# Patient Record
Sex: Female | Born: 1950 | Race: Black or African American | Hispanic: No | Marital: Married | Smoking: Former smoker
Health system: Southern US, Community
[De-identification: ages and names within clinical notes are randomized; demographics above are authoritative.]

## PROBLEM LIST (undated history)

## (undated) DIAGNOSIS — C719 Malignant neoplasm of brain, unspecified: Secondary | ICD-10-CM

## (undated) DIAGNOSIS — M109 Gout, unspecified: Secondary | ICD-10-CM

## (undated) DIAGNOSIS — F329 Major depressive disorder, single episode, unspecified: Secondary | ICD-10-CM

## (undated) DIAGNOSIS — Z9884 Bariatric surgery status: Secondary | ICD-10-CM

## (undated) DIAGNOSIS — F32A Depression, unspecified: Secondary | ICD-10-CM

## (undated) DIAGNOSIS — K8020 Calculus of gallbladder without cholecystitis without obstruction: Secondary | ICD-10-CM

## (undated) DIAGNOSIS — I10 Essential (primary) hypertension: Secondary | ICD-10-CM

## (undated) DIAGNOSIS — L309 Dermatitis, unspecified: Secondary | ICD-10-CM

## (undated) DIAGNOSIS — E669 Obesity, unspecified: Secondary | ICD-10-CM

## (undated) DIAGNOSIS — I872 Venous insufficiency (chronic) (peripheral): Secondary | ICD-10-CM

## (undated) DIAGNOSIS — H8110 Benign paroxysmal vertigo, unspecified ear: Secondary | ICD-10-CM

## (undated) DIAGNOSIS — G4733 Obstructive sleep apnea (adult) (pediatric): Secondary | ICD-10-CM

## (undated) DIAGNOSIS — E785 Hyperlipidemia, unspecified: Secondary | ICD-10-CM

## (undated) DIAGNOSIS — E559 Vitamin D deficiency, unspecified: Secondary | ICD-10-CM

## (undated) HISTORY — DX: Calculus of gallbladder without cholecystitis without obstruction: K80.20

## (undated) HISTORY — DX: Obstructive sleep apnea (adult) (pediatric): G47.33

## (undated) HISTORY — DX: Bariatric surgery status: Z98.84

## (undated) HISTORY — DX: Vitamin D deficiency, unspecified: E55.9

## (undated) HISTORY — DX: Benign paroxysmal vertigo, unspecified ear: H81.10

## (undated) HISTORY — DX: Hyperlipidemia, unspecified: E78.5

## (undated) HISTORY — DX: Depression, unspecified: F32.A

## (undated) HISTORY — DX: Venous insufficiency (chronic) (peripheral): I87.2

## (undated) HISTORY — DX: Dermatitis, unspecified: L30.9

## (undated) HISTORY — DX: Obesity, unspecified: E66.9

## (undated) HISTORY — DX: Gout, unspecified: M10.9

## (undated) HISTORY — DX: Essential (primary) hypertension: I10

---
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
## 2001-01-22 HISTORY — PX: BUNIONECTOMY: SHX129

## 2006-01-22 HISTORY — PX: LAPAROSCOPIC GASTRIC BANDING: SHX1100

## 2008-04-15 LAB — HM DEXA SCAN

## 2009-01-22 LAB — HM COLONOSCOPY: HM Colonoscopy: NORMAL

## 2011-04-23 DIAGNOSIS — Z9884 Bariatric surgery status: Secondary | ICD-10-CM | POA: Insufficient documentation

## 2011-04-23 HISTORY — PX: LAPAROSCOPIC GASTRIC BAND REMOVAL WITH LAPAROSCOPIC GASTRIC SLEEVE RESECTION: SHX6498

## 2012-02-26 ENCOUNTER — Ambulatory Visit: Payer: Self-pay | Admitting: Specialist

## 2012-03-10 ENCOUNTER — Ambulatory Visit: Payer: Self-pay | Admitting: Specialist

## 2012-03-10 LAB — CBC WITH DIFFERENTIAL/PLATELET
Basophil #: 0 x10 3/mm 3
Basophil %: 0.7 %
Eosinophil #: 0.2 x10 3/mm 3
Eosinophil %: 2.9 %
HCT: 41.4 %
HGB: 13.7 g/dL
Lymphocyte %: 25.7 %
Lymphs Abs: 1.8 x10 3/mm 3
MCH: 30.5 pg
MCHC: 33.1 g/dL
MCV: 92 fL
Monocyte #: 0.4 "x10 3/mm "
Monocyte %: 5.5 %
Neutrophil #: 4.5 x10 3/mm 3
Neutrophil %: 65.2 %
Platelet: 333 x10 3/mm 3
RBC: 4.49 X10 6/mm 3
RDW: 14.3 %
WBC: 7 x10 3/mm 3

## 2012-03-10 LAB — LIPASE, BLOOD: Lipase: 113 U/L (ref 73–393)

## 2012-03-10 LAB — COMPREHENSIVE METABOLIC PANEL WITH GFR
Albumin: 3.6 g/dL
Alkaline Phosphatase: 106 U/L
Anion Gap: 7
BUN: 18 mg/dL
Bilirubin,Total: 0.5 mg/dL
Calcium, Total: 9.4 mg/dL
Chloride: 107 mmol/L
Co2: 29 mmol/L
Creatinine: 1.05 mg/dL
EGFR (African American): >60
EGFR (Non-African Amer.): 57 — ABNORMAL LOW
Glucose: 87 mg/dL
Osmolality: 286
Potassium: 4.1 mmol/L
SGOT(AST): 66 U/L — ABNORMAL HIGH
SGPT (ALT): 104 U/L — ABNORMAL HIGH
Sodium: 143 mmol/L
Total Protein: 7.9 g/dL

## 2012-03-10 LAB — FOLATE: Folic Acid: 13.2 ng/mL

## 2012-03-10 LAB — IRON AND TIBC
Iron Bind.Cap.(Total): 292 ug/dL (ref 250–450)
Iron Saturation: 33 %
Iron: 97 ug/dL (ref 50–170)

## 2012-03-10 LAB — FERRITIN: Ferritin (ARMC): 180 ng/mL

## 2012-03-10 LAB — PROTIME-INR
INR: 0.9
Prothrombin Time: 12.2 s

## 2012-03-10 LAB — AMYLASE: Amylase: 43 U/L (ref 25–115)

## 2012-03-10 LAB — MAGNESIUM: Magnesium: 1.6 mg/dL — ABNORMAL LOW

## 2012-03-10 LAB — TSH: Thyroid Stimulating Horm: 3.94 u[IU]/mL

## 2012-03-10 LAB — APTT: Activated PTT: 32 s

## 2012-03-11 LAB — BILIRUBIN, DIRECT: Bilirubin, Direct: <0.1 mg/dL (ref 0.00–0.20)

## 2012-03-22 ENCOUNTER — Ambulatory Visit: Payer: Self-pay | Admitting: Specialist

## 2012-05-20 ENCOUNTER — Ambulatory Visit: Payer: Self-pay | Admitting: Specialist

## 2012-05-22 LAB — HM PAP SMEAR: HM Pap smear: NORMAL

## 2012-06-22 ENCOUNTER — Ambulatory Visit: Payer: Self-pay | Admitting: Specialist

## 2013-08-06 LAB — HM MAMMOGRAPHY: HM Mammogram: NORMAL

## 2013-08-13 LAB — LIPID PANEL
CHOLESTEROL: 223 mg/dL — AB (ref 0–200)
HDL: 66 mg/dL (ref 35–70)
LDL CALC: 126 mg/dL
Triglycerides: 157 mg/dL (ref 40–160)

## 2014-02-25 ENCOUNTER — Ambulatory Visit: Payer: Self-pay | Admitting: Gastroenterology

## 2014-09-10 DIAGNOSIS — Z1231 Encounter for screening mammogram for malignant neoplasm of breast: Secondary | ICD-10-CM | POA: Diagnosis not present

## 2014-09-10 DIAGNOSIS — Z1211 Encounter for screening for malignant neoplasm of colon: Secondary | ICD-10-CM | POA: Diagnosis not present

## 2014-09-10 DIAGNOSIS — R87820 Cervical low risk human papillomavirus (HPV) DNA test positive: Secondary | ICD-10-CM | POA: Insufficient documentation

## 2014-09-10 DIAGNOSIS — Z01419 Encounter for gynecological examination (general) (routine) without abnormal findings: Secondary | ICD-10-CM | POA: Diagnosis not present

## 2014-09-10 DIAGNOSIS — R87810 Cervical high risk human papillomavirus (HPV) DNA test positive: Secondary | ICD-10-CM | POA: Diagnosis not present

## 2014-09-10 LAB — HM PAP SMEAR: HM PAP: POSITIVE

## 2014-09-10 LAB — HM MAMMOGRAPHY: HM MAMMO: NORMAL (ref 0–4)

## 2014-09-14 ENCOUNTER — Other Ambulatory Visit: Payer: Self-pay | Admitting: Family Medicine

## 2014-09-14 NOTE — Telephone Encounter (Signed)
Patient requesting refill. 

## 2014-09-29 ENCOUNTER — Encounter: Payer: Self-pay | Admitting: Family Medicine

## 2014-10-18 ENCOUNTER — Encounter: Payer: Self-pay | Admitting: Family Medicine

## 2014-10-18 ENCOUNTER — Ambulatory Visit (INDEPENDENT_AMBULATORY_CARE_PROVIDER_SITE_OTHER): Payer: Medicare Other | Admitting: Family Medicine

## 2014-10-18 VITALS — BP 128/84 | HR 61 | Temp 98.1°F | Resp 16 | Ht 66.0 in | Wt 248.6 lb

## 2014-10-18 DIAGNOSIS — Z0000 Encounter for general adult medical examination without abnormal findings: Secondary | ICD-10-CM | POA: Diagnosis not present

## 2014-10-18 DIAGNOSIS — M109 Gout, unspecified: Secondary | ICD-10-CM | POA: Diagnosis not present

## 2014-10-18 DIAGNOSIS — Z9989 Dependence on other enabling machines and devices: Secondary | ICD-10-CM

## 2014-10-18 DIAGNOSIS — I872 Venous insufficiency (chronic) (peripheral): Secondary | ICD-10-CM | POA: Insufficient documentation

## 2014-10-18 DIAGNOSIS — E785 Hyperlipidemia, unspecified: Secondary | ICD-10-CM | POA: Diagnosis not present

## 2014-10-18 DIAGNOSIS — G4733 Obstructive sleep apnea (adult) (pediatric): Secondary | ICD-10-CM

## 2014-10-18 DIAGNOSIS — E559 Vitamin D deficiency, unspecified: Secondary | ICD-10-CM | POA: Diagnosis not present

## 2014-10-18 DIAGNOSIS — Z9884 Bariatric surgery status: Secondary | ICD-10-CM

## 2014-10-18 DIAGNOSIS — Z23 Encounter for immunization: Secondary | ICD-10-CM

## 2014-10-18 DIAGNOSIS — L309 Dermatitis, unspecified: Secondary | ICD-10-CM | POA: Insufficient documentation

## 2014-10-18 DIAGNOSIS — I10 Essential (primary) hypertension: Secondary | ICD-10-CM

## 2014-10-18 DIAGNOSIS — K2270 Barrett's esophagus without dysplasia: Secondary | ICD-10-CM | POA: Diagnosis not present

## 2014-10-18 DIAGNOSIS — R42 Dizziness and giddiness: Secondary | ICD-10-CM | POA: Diagnosis not present

## 2014-10-18 DIAGNOSIS — K219 Gastro-esophageal reflux disease without esophagitis: Secondary | ICD-10-CM | POA: Insufficient documentation

## 2014-10-18 DIAGNOSIS — E2839 Other primary ovarian failure: Secondary | ICD-10-CM | POA: Diagnosis not present

## 2014-10-18 MED ORDER — VITAMIN D 1000 UNITS PO TABS: 1000.0000 [IU] | ORAL_TABLET | Freq: Every day | ORAL | Status: AC

## 2014-10-18 NOTE — Progress Notes (Signed)
Name: Shannon Petty   MRN: [id number]    DOB: [birth year]   Date:10/18/2014       Progress Note  Subjective  Chief Complaint  Chief Complaint  Patient presents with  . Annual Exam    HPI  Functional ability/safety issues: No Issues Hearing issues: Addressed  Activities of daily living: Discussed Home safety issues: discussed removing lose rugs, night lights, tub bench, raised toilett seat  End Of Life Planning: Offered verbal information regarding advanced directives, healthcare power of attorney. ( they are working on it now)   Preventative care, Health maintenance, Preventative health measures discussed.  Preventative screenings discussed today: lab work, colonoscopy, PAP, mammogram, DEXA.  Low Dose CT Chest recommended if Age 71-80 years, 30 pack-year currently smoking OR have quit w/in 15years. Quit smoking over 15 years ago   Lifestyle risk factor issued reviewed: Diet, exercise, weight management, advised patient smoking is not healthy, nutrition/diet.  Preventative health measures discussed (5-10 year plan).  Reviewed and recommended vaccinations: - Pneumovax  - Prevnar  - Annual Influenza - Zostavax - Tdap   Depression screening: Done Fall risk screening: Done Discuss ADLs/IADLs: Done  Current medical providers: See HPI  Other health risk factors identified this visit: No other issues Cognitive impairment issues: None identified  All above discussed with patient. Appropriate education, counseling and referral will be made based upon the above.    Vertigo: she has a long history of intermittent vertigo, however over the past week has been worse. Symptoms of spinning sensation associated with nausea but no vomiting, happens with head movement, such as turning in bed during the night. She is feeling a little better today. No hearing loss, no tinnitus, no neuro deficit.   HTN: taking bp medication daily and no SOB, no chest pain, no palpitation. No side effects.    Hyperlipidemia: taking Atorvastatin daily , no side effects  Gout: she states she has not have a gout attacks in years, taking Allopurinol   OSA: she wears CPAP machine every night for at least 7-8 hours. No snoring with CPAP, wakes up feeling rested.  Barrett Esophagus: sees Dr. Durwin Reges, currently skipping Nexium because she saw an article that there were risks associated with taking medication. Symptoms under control, occasionally has regurgitation. Explained the importance of compliance to decrease change of esophageal cancer  Morbid Obesity: had lap band in 2008 and revision with sleeve in 2013, she gained a couple of pounds since last visit, she needs to increase physical activity   Patient Active Problem List   Diagnosis Date Noted  . Barrett esophagus 10/18/2014  . Morbid obesity 10/18/2014  . Chronic eczema 10/18/2014  . GERD (gastroesophageal reflux disease) 10/18/2014  . Hypertension, benign 10/18/2014  . Vitamin D deficiency 10/18/2014  . Controlled gout 10/18/2014  . Hyperlipidemia 10/18/2014  . Venous insufficiency 10/18/2014  . OSA on CPAP 10/18/2014  . History of bariatric surgery 04/23/2011    Past Surgical History  Procedure Laterality Date  . Bunionectomy  2003  . Bariatric surgery  2008  . Sleeve gastroplasty  04/23/2011    Family History  Problem Relation Age of Onset  . Hypertension Mother   . Diabetes Mother   . Diabetes Brother     1  . Multiple sclerosis Brother     1-Deceased    Social History   Social History  . Marital Status: Married    Spouse Name: N/A  . Number of Children: N/A  . Years of Education: N/A   Occupational  History  . Not on file.   Social History Main Topics  . Smoking status: Former Smoker    Types: Cigarettes    Quit date: 10/17/1984  . Smokeless tobacco: Never Used  . Alcohol Use: 0.0 oz/week    0 Standard drinks or equivalent per week     Comment: socially  . Drug Use: No  . Sexual Activity:    Partners:  Male   Other Topics Concern  . Not on file   Social History Narrative     Current outpatient prescriptions:  .  aspirin 81 MG tablet, Take 81 mg by mouth daily., Disp: , Rfl:  .  NEXIUM 40 MG capsule, Take 40 mg by mouth 2 (two) times daily., Disp: , Rfl: 11 .  spironolactone (ALDACTONE) 50 MG tablet, TAKE 1 TABLET BY MOUTH EVERY DAY, Disp: 90 tablet, Rfl: 0 .  ULORIC 40 MG tablet, TAKE 1 TABLET BY MOUTH EVERY DAY, Disp: 90 tablet, Rfl: 0 .  cholecalciferol (VITAMIN D) 1000 UNITS tablet, Take 1 tablet (1,000 Units total) by mouth daily., Disp: 30 tablet, Rfl: 0  Allergies  Allergen Reactions  . Allopurinol      ROS  Constitutional: Negative for fever or weight change.  Respiratory: Negative for cough and shortness of breath.   Cardiovascular: Negative for chest pain or palpitations.  Gastrointestinal: Negative for abdominal pain, no bowel changes.  Musculoskeletal: Negative for gait problem or joint swelling.  Skin: Negative for rash.  Neurological: Positive for dizziness no  headache.  No other specific complaints in a complete review of systems (except as listed in HPI above).   Objective  Filed Vitals:   10/18/14 1137  BP: 128/84  Pulse: 61  Temp: 98.1 F (36.7 C)  TempSrc: Oral  Resp: 16  Height: 5\' 6"  (1.676 m)  Weight: 248 lb 9.6 oz (112.764 kg)  SpO2: 92%    Body mass index is 40.14 kg/(m^2).  Physical Exam  Constitutional: Patient appears well-developed and obese. No distress.  HENT: Head: Normocephalic and atraumatic. Ears: B TMs ok, no erythema or effusion; Nose: Nose normal. Mouth/Throat: Oropharynx is clear and moist. No oropharyngeal exudate.  Eyes: Conjunctivae and EOM are normal. Pupils are equal, round, and reactive to light. No scleral icterus.  Neck: Normal range of motion. Neck supple. No JVD present. No thyromegaly present.  Cardiovascular: Normal rate, regular rhythm and normal heart sounds.  No murmur heard. No BLE  edema. Pulmonary/Chest: Effort normal and breath sounds normal. No respiratory distress. Abdominal: Soft. Bowel sounds are normal, no distension. There is no tenderness. no masses Musculoskeletal: Normal range of motion, no joint effusions. No gross deformities Neurological: he is alert and oriented to person, place, and time. No cranial nerve deficit. Coordination, balance, strength, speech and gait are normal.  Skin: Skin is warm and dry. No rash noted. No erythema.  Psychiatric: Patient has a normal mood and affect. behavior is normal. Judgment and thought content normal.  PHQ2/9: Depression screen PHQ 2/9 10/18/2014  Decreased Interest 0  Down, Depressed, Hopeless 0  PHQ - 2 Score 0    Fall Risk: Fall Risk  10/18/2014  Falls in the past year? No     Functional Status Survey: Is the patient deaf or have difficulty hearing?: No Does the patient have difficulty seeing, even when wearing glasses/contacts?: Yes (glasses) Does the patient have difficulty concentrating, remembering, or making decisions?: No Does the patient have difficulty walking or climbing stairs?: No Does the patient have difficulty dressing or  bathing?: No Does the patient have difficulty doing errands alone such as visiting a doctor's office or shopping?: No   Assessment & Plan  1. Welcome to Medicare preventive visit  Discussed importance of 150 minutes of physical activity weekly, eat two servings of fish weekly, eat one serving of tree nuts ( cashews, pistachios, pecans, almonds.Marland Kitchen) every other day, eat 6 servings of fruit/vegetables daily and drink plenty of water and avoid sweet beverages.   2. Needs flu shot  - Flu vaccine HIGH DOSE PF (Fluzone High dose)  3. Barrett esophagus  Resume twice daily PPI  4. Morbid obesity  Discussed [employer]   5. Hypertension, benign   - Comprehensive metabolic panel - CBC with Differential/Platelet - EKG 12-Lead  6. Vitamin D deficiency  -  cholecalciferol (VITAMIN D) 1000 UNITS tablet; Take 1 tablet (1,000 Units total) by mouth daily.  Dispense: 30 tablet; Refill: 0 - Vit D  25 hydroxy (rtn osteoporosis monitoring)  7. History of bariatric surgery  - DG Bone Density; Future  8. Controlled gout  - Uric acid  9. Hyperlipidemia  - Lipid panel  10. OSA on CPAP   11. Ovarian failure  - DG Bone Density; Future  12. Need for pneumococcal vaccination  - Pneumococcal polysaccharide vaccine 23-valent greater than or equal to 2yo subcutaneous/IM  13. Vertigo   likely BPPV, refer to ENT - Ambulatory referral to ENT

## 2014-12-03 ENCOUNTER — Other Ambulatory Visit: Payer: Self-pay | Admitting: Family Medicine

## 2014-12-03 DIAGNOSIS — E785 Hyperlipidemia, unspecified: Secondary | ICD-10-CM

## 2014-12-03 DIAGNOSIS — E559 Vitamin D deficiency, unspecified: Secondary | ICD-10-CM

## 2014-12-03 DIAGNOSIS — M109 Gout, unspecified: Secondary | ICD-10-CM

## 2014-12-03 DIAGNOSIS — I10 Essential (primary) hypertension: Secondary | ICD-10-CM

## 2014-12-14 ENCOUNTER — Other Ambulatory Visit: Payer: Self-pay | Admitting: Family Medicine

## 2014-12-14 NOTE — Telephone Encounter (Signed)
Patient requesting refill. 

## 2014-12-15 NOTE — Telephone Encounter (Signed)
Left voice message to inform patient that prescriptions where sent to the pharmacy and that she need to be seen in March 2017

## 2014-12-20 ENCOUNTER — Ambulatory Visit (INDEPENDENT_AMBULATORY_CARE_PROVIDER_SITE_OTHER): Payer: Medicare Other | Admitting: Family Medicine

## 2014-12-20 ENCOUNTER — Encounter: Payer: Self-pay | Admitting: Family Medicine

## 2014-12-20 VITALS — BP 138/100 | HR 64 | Temp 98.9°F | Resp 16 | Wt 254.8 lb

## 2014-12-20 DIAGNOSIS — R110 Nausea: Secondary | ICD-10-CM

## 2014-12-20 DIAGNOSIS — R42 Dizziness and giddiness: Secondary | ICD-10-CM

## 2014-12-20 DIAGNOSIS — I10 Essential (primary) hypertension: Secondary | ICD-10-CM | POA: Diagnosis not present

## 2014-12-20 MED ORDER — PROMETHAZINE HCL 12.5 MG PO TABS: 12.5000 mg | ORAL_TABLET | Freq: Three times a day (TID) | ORAL | Status: DC | PRN

## 2014-12-20 NOTE — Patient Instructions (Signed)
Benign Positional Vertigo Vertigo is the feeling that you or your surroundings are moving when they are not. Benign positional vertigo is the most common form of vertigo. The cause of this condition is not serious (is benign). This condition is triggered by certain movements and positions (is positional). This condition can be dangerous if it occurs while you are doing something that could endanger you or others, such as driving.  CAUSES In many cases, the cause of this condition is not known. It may be caused by a disturbance in an area of the inner ear that helps your brain to sense movement and balance. This disturbance can be caused by a viral infection (labyrinthitis), head injury, or repetitive motion. RISK FACTORS This condition is more likely to develop in:  Women.  People who are 50 years of age or older. SYMPTOMS Symptoms of this condition usually happen when you move your head or your eyes in different directions. Symptoms may start suddenly, and they usually last for less than a minute. Symptoms may include:  Loss of balance and falling.  Feeling like you are spinning or moving.  Feeling like your surroundings are spinning or moving.  Nausea and vomiting.  Blurred vision.  Dizziness.  Involuntary eye movement (nystagmus). Symptoms can be mild and cause only slight annoyance, or they can be severe and interfere with daily life. Episodes of benign positional vertigo may return (recur) over time, and they may be triggered by certain movements. Symptoms may improve over time. DIAGNOSIS This condition is usually diagnosed by medical history and a physical exam of the head, neck, and ears. You may be referred to a health care provider who specializes in ear, nose, and throat (ENT) problems (otolaryngologist) or a provider who specializes in disorders of the nervous system (neurologist). You may have additional testing, including:  MRI.  A CT scan.  Eye movement tests. Your  health care provider may ask you to change positions quickly while he or she watches you for symptoms of benign positional vertigo, such as nystagmus. Eye movement may be tested with an electronystagmogram (ENG), caloric stimulation, the Dix-Hallpike test, or the roll test.  An electroencephalogram (EEG). This records electrical activity in your brain.  Hearing tests. TREATMENT Usually, your health care provider will treat this by moving your head in specific positions to adjust your inner ear back to normal. Surgery may be needed in severe cases, but this is rare. In some cases, benign positional vertigo may resolve on its own in 2-4 weeks. HOME CARE INSTRUCTIONS Safety  Move slowly.Avoid sudden body or head movements.  Avoid driving.  Avoid operating heavy machinery.  Avoid doing any tasks that would be dangerous to you or others if a vertigo episode would occur.  If you have trouble walking or keeping your balance, try using a cane for stability. If you feel dizzy or unstable, sit down right away.  Return to your normal activities as told by your health care provider. Ask your health care provider what activities are safe for you. General Instructions  Take over-the-counter and prescription medicines only as told by your health care provider.  Avoid certain positions or movements as told by your health care provider.  Drink enough fluid to keep your urine clear or pale yellow.  Keep all follow-up visits as told by your health care provider. This is important. SEEK MEDICAL CARE IF:  You have a fever.  Your condition gets worse or you develop new symptoms.  Your family or friends   notice any behavioral changes.  Your nausea or vomiting gets worse.  You have numbness or a "pins and needles" sensation. SEEK IMMEDIATE MEDICAL CARE IF:  You have difficulty speaking or moving.  You are always dizzy.  You faint.  You develop severe headaches.  You have weakness in your  legs or arms.  You have changes in your hearing or vision.  You develop a stiff neck.  You develop sensitivity to light.   This information is not intended to replace advice given to you by your health care provider. Make sure you discuss any questions you have with your health care provider.   Document Released: 10/16/2005 Document Revised: 09/29/2014 Document Reviewed: 05/03/2014 Elsevier Interactive Patient Education 2016 [employer]  

## 2014-12-20 NOTE — Progress Notes (Signed)
Name: Shannon Petty   MRN: [id number]    DOB: [birth year]   Date:12/20/2014       Progress Note  Subjective  Chief Complaint  Chief Complaint  Patient presents with  . Dizziness    onset 4 days    HPI  Vertigo: she states she has noticed that for years has intermittent episodes of vertigo while rolling in bed, however for the past 4 days symptoms have been severe and constant, she stumbles when she walks, has to hold on to walls. She has also noticed a heavy feeling on frontal area. No tinnitus no hearing loss, no cold symptoms or fever. She states dizziness has been is constant but with periods of exacerbation.   Patient Active Problem List   Diagnosis Date Noted  . Barrett esophagus 10/18/2014  . Morbid obesity ([place]) 10/18/2014  . Chronic eczema 10/18/2014  . GERD (gastroesophageal reflux disease) 10/18/2014  . Hypertension, benign 10/18/2014  . Vitamin D deficiency 10/18/2014  . Controlled gout 10/18/2014  . Hyperlipidemia 10/18/2014  . Venous insufficiency 10/18/2014  . OSA on CPAP 10/18/2014  . History of bariatric surgery 04/23/2011    Past Surgical History  Procedure Laterality Date  . Bunionectomy  2003  . Bariatric surgery  2008  . Sleeve gastroplasty  04/23/2011    Family History  Problem Relation Age of Onset  . Hypertension Mother   . Diabetes Mother   . Diabetes Brother     1  . Multiple sclerosis Brother     1-Deceased    Social History   Social History  . Marital Status: Married    Spouse Name: N/A  . Number of Children: N/A  . Years of Education: N/A   Occupational History  . Not on file.   Social History Main Topics  . Smoking status: Former Smoker    Types: Cigarettes    Quit date: 10/17/1984  . Smokeless tobacco: Never Used  . Alcohol Use: 0.0 oz/week    0 Standard drinks or equivalent per week     Comment: socially  . Drug Use: No  . Sexual Activity:    Partners: Male   Other Topics Concern  . Not on file   Social History  Narrative     Current outpatient prescriptions:  .  aspirin 81 MG tablet, Take 81 mg by mouth daily., Disp: , Rfl:  .  cholecalciferol (VITAMIN D) 1000 UNITS tablet, Take 1 tablet (1,000 Units total) by mouth daily., Disp: 30 tablet, Rfl: 0 .  NEXIUM 40 MG capsule, Take 40 mg by mouth 2 (two) times daily., Disp: , Rfl: 11 .  spironolactone (ALDACTONE) 50 MG tablet, TAKE 1 TABLET BY MOUTH EVERY DAY, Disp: 90 tablet, Rfl: 0 .  ULORIC 40 MG tablet, TAKE 1 TABLET BY MOUTH EVERY DAY, Disp: 90 tablet, Rfl: 0  Allergies  Allergen Reactions  . Allopurinol      ROS  Ten systems reviewed and is negative except as mentioned in HPI   Objective  Filed Vitals:   12/20/14 1524  BP: 142/88  Pulse: 64  Temp: 98.9 F (37.2 C)  TempSrc: Oral  Resp: 16  Weight: 254 lb 12.8 oz (115.577 kg)  SpO2: 94%    Body mass index is 41.15 kg/(m^2).  Physical Exam  Constitutional: Patient appears well-developed and well-nourished. Obese  No distress.  HEENT: head atraumatic, normocephalic, pupils equal and reactive to light, ears normal TM bilaterally,  neck supple, throat within normal limits Cardiovascular: Normal rate, regular rhythm  and normal heart sounds.  No murmur heard. No BLE edema. Pulmonary/Chest: Effort normal and breath sounds normal. No respiratory distress. Abdominal: Soft.  There is no tenderness. Psychiatric: Patient has a normal mood and affect. behavior is normal. Judgment and thought content normal. Neurological: cranial nerves within normal limits, brachial reflexes normal bilateral, normal grip, Romberg negative, no nystagmus  PHQ2/9: Depression screen [facility] 2/9 12/20/2014 10/18/2014  Decreased Interest 0 0  Down, Depressed, Hopeless 0 0  PHQ - 2 Score 0 0    Fall Risk: Fall Risk  12/20/2014 10/18/2014  Falls in the past year? No No    Functional Status Survey: Is the patient deaf or have difficulty hearing?: No Does the patient have difficulty seeing, even when wearing  glasses/contacts?: Yes (glasses) Does the patient have difficulty concentrating, remembering, or making decisions?: No Does the patient have difficulty walking or climbing stairs?: No Does the patient have difficulty dressing or bathing?: No Does the patient have difficulty doing errands alone such as visiting a doctor's office or shopping?: No    Assessment & Plan  1. Vertigo  - Ambulatory referral to ENT, likely BPPV.  2. Hypertension, benign  bp is high today, but has been at goal at home, in the 130's/80's advised to continue monitoring for now and call back if it remains elevated.

## 2014-12-30 ENCOUNTER — Ambulatory Visit
Admission: RE | Admit: 2014-12-30 | Discharge: 2014-12-30 | Disposition: A | Discharge status: Home / Self Care | Payer: Medicare Other | Source: Ambulatory Visit | Attending: Family Medicine | Admitting: Family Medicine

## 2014-12-30 DIAGNOSIS — Z13820 Encounter for screening for osteoporosis: Secondary | ICD-10-CM | POA: Insufficient documentation

## 2014-12-30 DIAGNOSIS — Z9884 Bariatric surgery status: Secondary | ICD-10-CM

## 2014-12-30 DIAGNOSIS — M810 Age-related osteoporosis without current pathological fracture: Secondary | ICD-10-CM | POA: Diagnosis not present

## 2014-12-30 DIAGNOSIS — E2839 Other primary ovarian failure: Secondary | ICD-10-CM | POA: Diagnosis present

## 2015-01-04 DIAGNOSIS — R42 Dizziness and giddiness: Secondary | ICD-10-CM | POA: Diagnosis not present

## 2015-01-04 DIAGNOSIS — H8111 Benign paroxysmal vertigo, right ear: Secondary | ICD-10-CM | POA: Diagnosis not present

## 2015-02-28 DIAGNOSIS — E559 Vitamin D deficiency, unspecified: Secondary | ICD-10-CM | POA: Diagnosis not present

## 2015-02-28 DIAGNOSIS — I10 Essential (primary) hypertension: Secondary | ICD-10-CM | POA: Diagnosis not present

## 2015-02-28 DIAGNOSIS — E785 Hyperlipidemia, unspecified: Secondary | ICD-10-CM | POA: Diagnosis not present

## 2015-03-01 LAB — COMPREHENSIVE METABOLIC PANEL
A/G RATIO: 1.7 (ref 1.1–2.5)
ALK PHOS: 93 IU/L (ref 39–117)
ALT: 14 IU/L (ref 0–32)
AST: 18 IU/L (ref 0–40)
Albumin: 4 g/dL (ref 3.6–4.8)
BILIRUBIN TOTAL: 0.4 mg/dL (ref 0.0–1.2)
BUN/Creatinine Ratio: 15 (ref 11–26)
BUN: 14 mg/dL (ref 8–27)
CHLORIDE: 104 mmol/L (ref 96–106)
CO2: 23 mmol/L (ref 18–29)
Calcium: 9.5 mg/dL (ref 8.7–10.3)
Creatinine, Ser: 0.91 mg/dL (ref 0.57–1.00)
GFR calc non Af Amer: 66 mL/min/{1.73_m2} (ref >59)
GFR, EST AFRICAN AMERICAN: 77 mL/min/{1.73_m2} (ref >59)
GLUCOSE: 91 mg/dL (ref 65–99)
Globulin, Total: 2.4 g/dL (ref 1.5–4.5)
POTASSIUM: 4.6 mmol/L (ref 3.5–5.2)
Sodium: 142 mmol/L (ref 134–144)
TOTAL PROTEIN: 6.4 g/dL (ref 6.0–8.5)

## 2015-03-01 LAB — CBC WITH DIFFERENTIAL/PLATELET
BASOS ABS: 0 10*3/uL (ref 0.0–0.2)
BASOS: 0 %
EOS (ABSOLUTE): 0.2 10*3/uL (ref 0.0–0.4)
Eos: 3 %
Hematocrit: 42.9 % (ref 34.0–46.6)
Hemoglobin: 13.8 g/dL (ref 11.1–15.9)
Immature Grans (Abs): 0 10*3/uL (ref 0.0–0.1)
Immature Granulocytes: 0 %
LYMPHS ABS: 2 10*3/uL (ref 0.7–3.1)
LYMPHS: 22 %
MCH: 30.1 pg (ref 26.6–33.0)
MCHC: 32.2 g/dL (ref 31.5–35.7)
MCV: 94 fL (ref 79–97)
MONOS ABS: 0.5 10*3/uL (ref 0.1–0.9)
Monocytes: 6 %
NEUTROS ABS: 6.5 10*3/uL (ref 1.4–7.0)
Neutrophils: 69 %
PLATELETS: 331 10*3/uL (ref 150–379)
RBC: 4.59 x10E6/uL (ref 3.77–5.28)
RDW: 14.8 % (ref 12.3–15.4)
WBC: 9.3 10*3/uL (ref 3.4–10.8)

## 2015-03-01 LAB — LIPID PANEL
CHOLESTEROL TOTAL: 206 mg/dL — AB (ref 100–199)
Chol/HDL Ratio: 2.7 ratio units (ref 0.0–4.4)
HDL: 75 mg/dL (ref >39)
LDL Calculated: 108 mg/dL — ABNORMAL HIGH (ref 0–99)
Triglycerides: 113 mg/dL (ref 0–149)
VLDL Cholesterol Cal: 23 mg/dL (ref 5–40)

## 2015-03-01 LAB — VITAMIN D 25 HYDROXY (VIT D DEFICIENCY, FRACTURES): VIT D 25 HYDROXY: 25.6 ng/mL — AB (ref 30.0–100.0)

## 2015-03-01 LAB — URIC ACID: URIC ACID: 4.1 mg/dL (ref 2.5–7.1)

## 2015-03-14 ENCOUNTER — Other Ambulatory Visit: Payer: Self-pay | Admitting: Family Medicine

## 2015-04-08 ENCOUNTER — Telehealth: Payer: Self-pay | Admitting: Family Medicine

## 2015-04-08 ENCOUNTER — Ambulatory Visit (INDEPENDENT_AMBULATORY_CARE_PROVIDER_SITE_OTHER): Payer: Medicare Other | Admitting: Family Medicine

## 2015-04-08 ENCOUNTER — Encounter: Payer: Self-pay | Admitting: Family Medicine

## 2015-04-08 VITALS — BP 136/94 | HR 74 | Temp 98.3°F | Resp 12 | Ht 66.0 in | Wt 262.4 lb

## 2015-04-08 DIAGNOSIS — Z9884 Bariatric surgery status: Secondary | ICD-10-CM | POA: Diagnosis not present

## 2015-04-08 DIAGNOSIS — M109 Gout, unspecified: Secondary | ICD-10-CM | POA: Diagnosis not present

## 2015-04-08 DIAGNOSIS — E559 Vitamin D deficiency, unspecified: Secondary | ICD-10-CM

## 2015-04-08 DIAGNOSIS — G4733 Obstructive sleep apnea (adult) (pediatric): Secondary | ICD-10-CM | POA: Diagnosis not present

## 2015-04-08 DIAGNOSIS — H8111 Benign paroxysmal vertigo, right ear: Secondary | ICD-10-CM

## 2015-04-08 DIAGNOSIS — I10 Essential (primary) hypertension: Secondary | ICD-10-CM | POA: Diagnosis not present

## 2015-04-08 DIAGNOSIS — E785 Hyperlipidemia, unspecified: Secondary | ICD-10-CM

## 2015-04-08 DIAGNOSIS — K2270 Barrett's esophagus without dysplasia: Secondary | ICD-10-CM

## 2015-04-08 DIAGNOSIS — Z9989 Dependence on other enabling machines and devices: Secondary | ICD-10-CM

## 2015-04-08 MED ORDER — FEBUXOSTAT 40 MG PO TABS: 40.0000 mg | ORAL_TABLET | Freq: Every day | ORAL | Status: DC

## 2015-04-08 MED ORDER — AMLODIPINE BESYLATE 2.5 MG PO TABS: 2.5000 mg | ORAL_TABLET | Freq: Every day | ORAL | Status: DC

## 2015-04-08 MED ORDER — SPIRONOLACTONE 50 MG PO TABS: 50.0000 mg | ORAL_TABLET | Freq: Every day | ORAL | Status: DC

## 2015-04-08 NOTE — Progress Notes (Signed)
Name: Shannon Petty   MRN: [id number]    DOB: 1950-06-27   Date:04/08/2015       Progress Note  Subjective  Chief Complaint  Chief Complaint  Patient presents with  . Medication Refill  . Gastroesophageal Reflux    Has started taking Nexium twice daily and has controlled her symptoms. If she only takes 1 her side effects come back  . Hypertension    Has gained 7 pounds since last visit  . Gout    Well controlled   . Dizziness    Patient needs to go back to ENT to help with her Vertigo    HPI   Vertigo: she has a long history of intermittent vertigo, previously seen by ENT and symptoms resolved, but over the past two weeks symptoms are back again. Symptoms of spinning sensation  happens with head movement.   HTN: taking bp medication daily and no SOB, no chest pain, no palpitation. No side effects.   Hyperlipidemia: she is taking Atorvastatin  Gout: she states she has not have a gout attacks in years, taking Allopurinol , uric acid is at goal   OSA: she wears CPAP machine every night for at least 7-8 hours. No snoring with CPAP, wakes up feeling rested, no headaches.  Barrett Esophagus: sees Dr. Durwin Reges, currently skipping Nexium because she saw an article that there were risks associated with taking medication. Symptoms under control, occasionally has regurgitation, causing cavities. Explained the importance of compliance to decrease change of esophageal cancer.   Morbid Obesity: had lap band in 2008 and revision with sleeve in 2013, she gained seven  pounds since last visit, she needs to increase physical activity and resume diet  Patient Active Problem List   Diagnosis Date Noted  . Barrett esophagus 10/18/2014  . Morbid obesity ([place]) 10/18/2014  . Chronic eczema 10/18/2014  . GERD (gastroesophageal reflux disease) 10/18/2014  . Hypertension, benign 10/18/2014  . Vitamin D deficiency 10/18/2014  . Controlled gout 10/18/2014  . Hyperlipidemia 10/18/2014  . Venous  insufficiency 10/18/2014  . OSA on CPAP 10/18/2014  . History of bariatric surgery 04/23/2011    Past Surgical History  Procedure Laterality Date  . Bunionectomy  2003  . Bariatric surgery  2008  . Sleeve gastroplasty  04/23/2011    Family History  Problem Relation Age of Onset  . Hypertension Mother   . Diabetes Mother   . Diabetes Brother     1  . Multiple sclerosis Brother     1-Deceased    Social History   Social History  . Marital Status: Married    Spouse Name: N/A  . Number of Children: N/A  . Years of Education: N/A   Occupational History  . Not on file.   Social History Main Topics  . Smoking status: Former Smoker    Types: Cigarettes    Quit date: 10/17/1984  . Smokeless tobacco: Never Used  . Alcohol Use: 0.0 oz/week    0 Standard drinks or equivalent per week     Comment: socially  . Drug Use: No  . Sexual Activity:    Partners: Male   Other Topics Concern  . Not on file   Social History Narrative     Current outpatient prescriptions:  .  amLODipine (NORVASC) 2.5 MG tablet, Take 1 tablet (2.5 mg total) by mouth daily., Disp: 30 tablet, Rfl: 2 .  aspirin 81 MG tablet, Take 81 mg by mouth daily., Disp: , Rfl:  .  cholecalciferol (VITAMIN  D) 1000 UNITS tablet, Take 1 tablet (1,000 Units total) by mouth daily., Disp: 30 tablet, Rfl: 0 .  febuxostat (ULORIC) 40 MG tablet, Take 1 tablet (40 mg total) by mouth daily., Disp: 90 tablet, Rfl: 1 .  meclizine (ANTIVERT) 25 MG tablet, TAKE ONE TABLET EVERY 8 HOURS AS NEEDED FOR VERTIGO, Disp: , Rfl: 10 .  NEXIUM 40 MG capsule, Take 40 mg by mouth 2 (two) times daily., Disp: , Rfl: 11 .  promethazine (PHENERGAN) 12.5 MG tablet, Take 1 tablet (12.5 mg total) by mouth every 8 (eight) hours as needed for nausea or vomiting., Disp: 20 tablet, Rfl: 0 .  spironolactone (ALDACTONE) 50 MG tablet, Take 1 tablet (50 mg total) by mouth daily., Disp: 90 tablet, Rfl: 1  Allergies  Allergen Reactions  . Allopurinol       ROS  Constitutional: Negative for fever, positive for  weight change.  Respiratory: Negative for cough and shortness of breath.   Cardiovascular: Negative for chest pain or palpitations.  Gastrointestinal: Negative for abdominal pain, no bowel changes.  Musculoskeletal: Negative for gait problem or joint swelling.  Skin: Negative for rash.  Neurological: Positive for dizziness, no headache.  No other specific complaints in a complete review of systems (except as listed in HPI above).  Objective  Filed Vitals:   04/08/15 0935  BP: 136/94  Pulse: 74  Temp: 98.3 F (36.8 C)  TempSrc: Oral  Resp: 12  Height: 5\' 6"  (1.676 m)  Weight: 262 lb 6.4 oz (119.024 kg)  SpO2: 97%    Body mass index is 42.37 kg/(m^2).  Physical Exam   Constitutional: Patient appears well-developed and well-nourished. Obese No distress.  HEENT: head atraumatic, normocephalic, pupils equal and reactive to light, neck supple, throat within normal limits Cardiovascular: Normal rate, regular rhythm and normal heart sounds.  No murmur heard. Trace of BLE edema. Pulmonary/Chest: Effort normal and breath sounds normal. No respiratory distress. Abdominal: Soft.  There is no tenderness. Psychiatric: Patient has a normal mood and affect. behavior is normal. Judgment and thought content normal.  Recent Results (from the past 2160 hour(s))  CBC with Differential/Platelet     Status: None   Collection Time: 02/28/15  8:37 AM  Result Value Ref Range   WBC 9.3 3.4 - 10.8 x10E3/uL   RBC 4.59 3.77 - 5.28 x10E6/uL   Hemoglobin 13.8 11.1 - 15.9 g/dL   Hematocrit 42.9 34.0 - 46.6 %   MCV 94 79 - 97 fL   MCH 30.1 26.6 - 33.0 pg   MCHC 32.2 31.5 - 35.7 g/dL   RDW 14.8 12.3 - 15.4 %   Platelets 331 150 - 379 x10E3/uL   Neutrophils 69 %   Lymphs 22 %   Monocytes 6 %   Eos 3 %   Basos 0 %   Neutrophils Absolute 6.5 1.4 - 7.0 x10E3/uL   Lymphocytes Absolute 2.0 0.7 - 3.1 x10E3/uL   Monocytes Absolute 0.5 0.1 -  0.9 x10E3/uL   EOS (ABSOLUTE) 0.2 0.0 - 0.4 x10E3/uL   Basophils Absolute 0.0 0.0 - 0.2 x10E3/uL   Immature Granulocytes 0 %   Immature Grans (Abs) 0.0 0.0 - 0.1 x10E3/uL  Comprehensive metabolic panel     Status: None   Collection Time: 02/28/15  8:37 AM  Result Value Ref Range   Glucose 91 65 - 99 mg/dL   BUN 14 8 - 27 mg/dL   Creatinine, Ser 0.91 0.57 - 1.00 mg/dL   GFR calc non Af Amer 66 >  59 mL/min/1.73   GFR calc Af Amer 77 >59 mL/min/1.73   BUN/Creatinine Ratio 15 11 - 26   Sodium 142 134 - 144 mmol/L   Potassium 4.6 3.5 - 5.2 mmol/L   Chloride 104 96 - 106 mmol/L   CO2 23 18 - 29 mmol/L   Calcium 9.5 8.7 - 10.3 mg/dL   Total Protein 6.4 6.0 - 8.5 g/dL   Albumin 4.0 3.6 - 4.8 g/dL   Globulin, Total 2.4 1.5 - 4.5 g/dL   Albumin/Globulin Ratio 1.7 1.1 - 2.5   Bilirubin Total 0.4 0.0 - 1.2 mg/dL   Alkaline Phosphatase 93 39 - 117 IU/L   AST 18 0 - 40 IU/L   ALT 14 0 - 32 IU/L  Lipid panel     Status: Abnormal   Collection Time: 02/28/15  8:37 AM  Result Value Ref Range   Cholesterol, Total 206 (H) 100 - 199 mg/dL   Triglycerides 113 0 - 149 mg/dL   HDL 75 >39 mg/dL   VLDL Cholesterol Cal 23 5 - 40 mg/dL   LDL Calculated 108 (H) 0 - 99 mg/dL   Chol/HDL Ratio 2.7 0.0 - 4.4 ratio units    Comment:                                   T. Chol/HDL Ratio                                             Men  Women                               1/2 Avg.Risk  3.4    3.3                                   Avg.Risk  5.0    4.4                                2X Avg.Risk  9.6    7.1                                3X Avg.Risk 23.4   11.0   VITAMIN D 25 Hydroxy (Vit-D Deficiency, Fractures)     Status: Abnormal   Collection Time: 02/28/15  8:37 AM  Result Value Ref Range   Vit D, 25-Hydroxy 25.6 (L) 30.0 - 100.0 ng/mL    Comment: Vitamin D deficiency has been defined by the [place] practice guideline as a level of serum 25-OH vitamin D less than 20  ng/mL (1,2). The Endocrine Society went on to further define vitamin D insufficiency as a level between 21 and 29 ng/mL (2). 1. IOM (Institute of Medicine). 2010. Dietary reference    intakes for calcium and D. [place]: The    [employer]. 2. Holick MF, Binkley [place], Bischoff-Ferrari HA, et al.    Evaluation, treatment, and prevention of vitamin D    deficiency: an Endocrine Society clinical practice    guideline. JCEM. 2011 Jul;  96(7):1911-30.   Uric acid     Status: None   Collection Time: 02/28/15  8:37 AM  Result Value Ref Range   Uric Acid 4.1 2.5 - 7.1 mg/dL    Comment:            Therapeutic target for gout patients: <6.0     PHQ2/9: Depression screen [facility] 2/9 04/08/2015 12/20/2014 10/18/2014  Decreased Interest 0 0 0  Down, Depressed, Hopeless 0 0 0  PHQ - 2 Score 0 0 0     Fall Risk: Fall Risk  04/08/2015 12/20/2014 10/18/2014  Falls in the past year? Yes No No  Number falls in past yr: 2 or more - -  Injury with Fall? No - -      Functional Status Survey: Is the patient deaf or have difficulty hearing?: No Does the patient have difficulty seeing, even when wearing glasses/contacts?: No Does the patient have difficulty concentrating, remembering, or making decisions?: No Does the patient have difficulty walking or climbing stairs?: No Does the patient have difficulty dressing or bathing?: No Does the patient have difficulty doing errands alone such as visiting a doctor's office or shopping?: No    Assessment & Plan  1. Hypertension, benign  We will add Norvasc, second time that bp has been elevated - spironolactone (ALDACTONE) 50 MG tablet; Take 1 tablet (50 mg total) by mouth daily.  Dispense: 90 tablet; Refill: 1 - amLODipine (NORVASC) 2.5 MG tablet; Take 1 tablet (2.5 mg total) by mouth daily.  Dispense: 30 tablet; Refill: 2  2. Morbid obesity, unspecified obesity type [facility])  Discussed with the patient the risk posed by an increased BMI.  Discussed importance of portion control, calorie counting and at least 150 minutes of physical activity weekly. Avoid sweet beverages and drink more water. Eat at least 6 servings of fruit and vegetables daily   3. Hyperlipidemia  She has been taking Atorvastatin  4. Barrett esophagus  We will check with Dr. Durwin Reges to see when she needs to go back  5. History of bariatric surgery  She had Sleeve done 2013 - she has gained weight since last visit, explained importance of checking his weight at home  6. Controlled gout  - febuxostat (ULORIC) 40 MG tablet; Take 1 tablet (40 mg total) by mouth daily.  Dispense: 90 tablet; Refill: 1  7. OSA on CPAP  Continue CPAP machine every night  8. Benign paroxysmal vertigo, right  - Ambulatory referral to ENT

## 2015-04-08 NOTE — Telephone Encounter (Signed)
02/25/14 colonoscopy & EGD was performed and she had a f/u on 03/18/14 with Dr. Allen Norris.  Repeat EGD  & colonoscopy in 80yrs.

## 2015-04-08 NOTE — Telephone Encounter (Signed)
Please contact Dr. Durwin Reges and ask if patient is due for repeat EGD, she has Barrett's esophagus. I can't find on our system the last EGD.

## 2015-04-12 ENCOUNTER — Ambulatory Visit: Payer: Medicare Other | Admitting: Family Medicine

## 2015-04-14 DIAGNOSIS — H8111 Benign paroxysmal vertigo, right ear: Secondary | ICD-10-CM | POA: Diagnosis not present

## 2015-04-14 DIAGNOSIS — R42 Dizziness and giddiness: Secondary | ICD-10-CM | POA: Diagnosis not present

## 2015-05-09 ENCOUNTER — Other Ambulatory Visit: Payer: Self-pay | Admitting: Gastroenterology

## 2015-05-12 ENCOUNTER — Other Ambulatory Visit: Payer: Self-pay

## 2015-05-12 DIAGNOSIS — K2100 Gastro-esophageal reflux disease with esophagitis, without bleeding: Secondary | ICD-10-CM

## 2015-05-12 MED ORDER — NEXIUM 40 MG PO CPDR: 40.0000 mg | DELAYED_RELEASE_CAPSULE | Freq: Two times a day (BID) | ORAL | Status: DC

## 2015-07-14 ENCOUNTER — Encounter: Payer: Self-pay | Admitting: Family Medicine

## 2015-07-14 ENCOUNTER — Ambulatory Visit (INDEPENDENT_AMBULATORY_CARE_PROVIDER_SITE_OTHER): Payer: Medicare Other | Admitting: Family Medicine

## 2015-07-14 VITALS — BP 132/88 | HR 67 | Temp 98.8°F | Resp 14 | Ht 66.0 in | Wt 260.1 lb

## 2015-07-14 DIAGNOSIS — G4733 Obstructive sleep apnea (adult) (pediatric): Secondary | ICD-10-CM

## 2015-07-14 DIAGNOSIS — E785 Hyperlipidemia, unspecified: Secondary | ICD-10-CM | POA: Diagnosis not present

## 2015-07-14 DIAGNOSIS — Z9989 Dependence on other enabling machines and devices: Secondary | ICD-10-CM

## 2015-07-14 DIAGNOSIS — K2270 Barrett's esophagus without dysplasia: Secondary | ICD-10-CM

## 2015-07-14 DIAGNOSIS — I10 Essential (primary) hypertension: Secondary | ICD-10-CM | POA: Diagnosis not present

## 2015-07-14 NOTE — Progress Notes (Signed)
Name: Shannon Petty   MRN: [id number]    DOB: 03-25-1950   Date:07/14/2015       Progress Note  Subjective  Chief Complaint  Chief Complaint  Patient presents with  . Medication Refill    3 month F/U  . Hypertension    Added Amlodipine on last visit and patient admits she has not taken every day but is trying to remember.  Edema in bilateral ankles.  . Gout    Stable  . Gastroesophageal Reflux    Due to cost- Insurance $140 a month for Nexium-30 day supply bid. So due to cost patient is having to extend her pills by only taking one pill a day. But patient will have burning and gagging if she skips any of her pills and even trys to elevated her bed and does not eat after 8 p.m.  . Dizziness    Patient went to ENT and is having current treatments    HPI  HTN: taking bp medication only a couple of times week, she denies  SOB, chest pain or palpitation. No side effects.   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   Gout: she states she has not have a gout attacks in years, taking Uloric, she has not been compliant with medication  OSA: she wears CPAP machine every night for at least 7-8 hours. No snoring with CPAP, wakes up feeling rested, no headaches. She has been very compliant with CPAP   Barrett Esophagus: sees Dr. Durwin Reges, she was taking Nexium twice daily but because of cost she is down to once daily, but has been taking it daily. She has been raising the head of the bed to control symptoms also.    Morbid Obesity: had lap band in 2008 and revision with sleeve in 2013, she had gained 7 lbs before last visit, but now she lost 2 lbs since last visit 3 months ago, she has not resumed activity but has been changing some of her diet     Patient Active Problem List    Diagnosis Date Noted  . Barrett esophagus 10/18/2014  . Morbid obesity ([place]) 10/18/2014  . Chronic eczema 10/18/2014  . GERD (gastroesophageal reflux disease) 10/18/2014  . Hypertension, benign 10/18/2014  . Vitamin D deficiency 10/18/2014  . Controlled gout 10/18/2014  . Hyperlipidemia 10/18/2014  . Venous insufficiency 10/18/2014  . OSA on CPAP 10/18/2014  . History of bariatric surgery 04/23/2011    Past Surgical History  Procedure Laterality Date  . Bunionectomy  2003  . Bariatric surgery  2008  . Sleeve gastroplasty  04/23/2011    Family History  Problem Relation Age of Onset  . Hypertension Mother   . Diabetes Mother   . Diabetes Brother     1  . Multiple sclerosis Brother     1-Deceased    Social History   Social History  . Marital Status: Married    Spouse Name: N/A  . Number of Children: N/A  . Years of Education: N/A   Occupational History  . Not on file.   Social History Main Topics  . Smoking status: Former Smoker    Types: Cigarettes    Quit date: 10/17/1984  . Smokeless tobacco: Never Used  . Alcohol Use: 0.0 oz/week    0 Standard drinks  or equivalent per week     Comment: socially  . Drug Use: No  . Sexual Activity:    Partners: Male   Other Topics Concern  . Not on file   Social History Narrative     Current outpatient prescriptions:  .  amLODipine (NORVASC) 2.5 MG tablet, Take 1 tablet (2.5 mg total) by mouth daily., Disp: 30 tablet, Rfl: 2 .  aspirin 81 MG tablet, Take 81 mg by mouth daily., Disp: , Rfl:  .  cholecalciferol (VITAMIN D) 1000 UNITS tablet, Take 1 tablet (1,000 Units total) by mouth daily., Disp: 30 tablet, Rfl: 0 .  febuxostat (ULORIC) 40 MG tablet, Take 1 tablet (40 mg total) by mouth daily., Disp: 90 tablet, Rfl: 1 .  meclizine (ANTIVERT) 25 MG tablet, TAKE ONE TABLET EVERY 8 HOURS AS NEEDED FOR VERTIGO, Disp: , Rfl: 10 .  NEXIUM 40 MG capsule, Take 1 capsule (40 mg total) by mouth 2 (two) times daily., Disp: 60  capsule, Rfl: 11 .  promethazine (PHENERGAN) 12.5 MG tablet, Take 1 tablet (12.5 mg total) by mouth every 8 (eight) hours as needed for nausea or vomiting., Disp: 20 tablet, Rfl: 0 .  spironolactone (ALDACTONE) 50 MG tablet, Take 1 tablet (50 mg total) by mouth daily., Disp: 90 tablet, Rfl: 1  Allergies  Allergen Reactions  . Allopurinol      ROS  Constitutional: Negative for fever or weight change.  Respiratory: Negative for cough and shortness of breath.   Cardiovascular: Negative for chest pain or palpitations.  Gastrointestinal: Negative for abdominal pain, no bowel changes.  Musculoskeletal: Negative for gait problem or joint swelling.  Skin: Negative for rash.  Neurological: Negative for dizziness( recently no episodes ) or headache.  No other specific complaints in a complete review of systems (except as listed in HPI above).  Objective  Filed Vitals:   07/14/15 0855  BP: 148/96  Pulse: 67  Temp: 98.8 F (37.1 C)  TempSrc: Oral  Resp: 14  Height: 5\' 6"  (1.676 m)  Weight: 260 lb 1.6 oz (117.981 kg)  SpO2: 98%    Body mass index is 42 kg/(m^2).  Physical Exam  Constitutional: Patient appears well-developed and well-nourished. Obese No distress.  HEENT: head atraumatic, normocephalic, pupils equal and reactive to light,  neck supple, throat within normal limits Cardiovascular: Normal rate, regular rhythm and normal heart sounds.  No murmur heard. No BLE edema. Pulmonary/Chest: Effort normal and breath sounds normal. No respiratory distress. Abdominal: Soft.  There is no tenderness. Psychiatric: Patient has a normal mood and affect. behavior is normal. Judgment and thought content normal.  PHQ2/9: Depression screen [facility] 2/9 07/14/2015 04/08/2015 12/20/2014 10/18/2014  Decreased Interest 0 0 0 0  Down, Depressed, Hopeless 0 0 0 0  PHQ - 2 Score 0 0 0 0    Fall Risk: Fall Risk  07/14/2015 04/08/2015 12/20/2014 10/18/2014  Falls in the past year? Yes Yes No No   Number falls in past yr: 1 2 or more - -  Injury with Fall? No No - -     Functional Status Survey: Is the patient deaf or have difficulty hearing?: No Does the patient have difficulty seeing, even when wearing glasses/contacts?: No Does the patient have difficulty concentrating, remembering, or making decisions?: No Does the patient have difficulty walking or climbing stairs?: No Does the patient have difficulty dressing or bathing?: No Does the patient have difficulty doing errands alone such as visiting a doctor's office or shopping?: No  Assessment & Plan  1. Hypertension, benign  Explained importance of taking medication as prescribed, she will try to increase compliance and we will recheck it on her CPE in the next month  2. Morbid obesity, unspecified obesity type [facility])  Discussed with the patient the risk posed by an increased BMI. Discussed importance of portion control, calorie counting and at least 150 minutes of physical activity weekly. Avoid sweet beverages and drink more water. Eat at least 6 servings of fruit and vegetables daily   3. Hyperlipidemia  Needs to resume Atorvastatin   4. OSA on CPAP  Continue CPAP every night  5. Barrett esophagus  Advised to contact Dr. Durwin Reges to see if he would like to change her medication to decrease cost and increase compliance

## 2015-09-23 DIAGNOSIS — H2513 Age-related nuclear cataract, bilateral: Secondary | ICD-10-CM | POA: Diagnosis not present

## 2015-10-19 ENCOUNTER — Other Ambulatory Visit: Payer: Self-pay | Admitting: Family Medicine

## 2015-10-19 ENCOUNTER — Ambulatory Visit (INDEPENDENT_AMBULATORY_CARE_PROVIDER_SITE_OTHER): Payer: Medicare Other | Admitting: Family Medicine

## 2015-10-19 ENCOUNTER — Encounter: Payer: Self-pay | Admitting: Family Medicine

## 2015-10-19 VITALS — BP 134/86 | HR 71 | Temp 98.3°F | Resp 16 | Ht 66.0 in | Wt 259.2 lb

## 2015-10-19 DIAGNOSIS — Z0000 Encounter for general adult medical examination without abnormal findings: Secondary | ICD-10-CM

## 2015-10-19 DIAGNOSIS — I10 Essential (primary) hypertension: Secondary | ICD-10-CM

## 2015-10-19 DIAGNOSIS — E785 Hyperlipidemia, unspecified: Secondary | ICD-10-CM | POA: Diagnosis not present

## 2015-10-19 DIAGNOSIS — Z23 Encounter for immunization: Secondary | ICD-10-CM | POA: Diagnosis not present

## 2015-10-19 DIAGNOSIS — M109 Gout, unspecified: Secondary | ICD-10-CM

## 2015-10-19 DIAGNOSIS — Z1239 Encounter for other screening for malignant neoplasm of breast: Secondary | ICD-10-CM

## 2015-10-19 DIAGNOSIS — Z1231 Encounter for screening mammogram for malignant neoplasm of breast: Secondary | ICD-10-CM

## 2015-10-19 MED ORDER — FEBUXOSTAT 40 MG PO TABS: 40.0000 mg | ORAL_TABLET | Freq: Every day | ORAL | 1 refills | Status: DC

## 2015-10-19 MED ORDER — SPIRONOLACTONE 50 MG PO TABS: 50.0000 mg | ORAL_TABLET | Freq: Every day | ORAL | 1 refills | Status: DC

## 2015-10-19 MED ORDER — AMLODIPINE BESYLATE 2.5 MG PO TABS: 2.5000 mg | ORAL_TABLET | Freq: Every day | ORAL | 1 refills | Status: DC

## 2015-10-19 MED ORDER — ATORVASTATIN CALCIUM 20 MG PO TABS: 20.0000 mg | ORAL_TABLET | Freq: Every day | ORAL | 1 refills | Status: DC

## 2015-10-19 NOTE — Telephone Encounter (Signed)
Patient requesting refill of Spironolactone to [id number].

## 2015-10-19 NOTE — Progress Notes (Signed)
Name: Shannon Petty   MRN: [id number]    DOB: 01-12-1951   Date:10/19/2015       Progress Note  Subjective  Chief Complaint  Chief Complaint  Patient presents with  . Annual Exam    HPI  Functional ability/safety issues: No Issues Hearing issues: Addressed  Activities of daily living: Discussed Home safety issues: No Issues  End Of Life Planning: Offered verbal information regarding advanced directives, healthcare power of attorney.  Preventative care, Health maintenance, Preventative health measures discussed.  Preventative screenings discussed today: lab work, colonoscopy up to date ,  mammogram, DEXA.  Low Dose CT Chest recommended if Age 11-80 years, 30 pack-year currently smoking OR have quit w/in 15years.   Lifestyle risk factor issued reviewed: Diet, exercise, weight management, advised patient smoking is not healthy, nutrition/diet.  Preventative health measures discussed (5-10 year plan).  Reviewed and recommended vaccinations: - Pneumovax  - Prevnar  - Annual Influenza - Zostavax - Tdap   Depression screening: Done Fall risk screening: Done Discuss ADLs/IADLs: Done  Current medical providers: See HPI  Other health risk factors identified this visit: No other issues Cognitive impairment issues: None identified  All above discussed with patient. Appropriate education, counseling and referral will be made based upon the above.   HTN: she was taking bp medication daily, but husband had surgery recently and she has been forgetting to take it daily, she denies  SOB, chest pain or palpitation. No side effects.   Hyperlipidemia: she is taking Atorvastatin but not recently, she will resume medication daily, advised baby aspirin also   Gout: she states she has not have a gout attacks in years, taking Uloric, she has not been compliant with medication. Last uric acid was at goal    Patient Active Problem List   Diagnosis Date Noted  . Barrett esophagus  10/18/2014  . Morbid obesity ([place]) 10/18/2014  . Chronic eczema 10/18/2014  . GERD (gastroesophageal reflux disease) 10/18/2014  . Hypertension, benign 10/18/2014  . Vitamin D deficiency 10/18/2014  . Controlled gout 10/18/2014  . Hyperlipidemia 10/18/2014  . Venous insufficiency 10/18/2014  . OSA on CPAP 10/18/2014  . History of bariatric surgery 04/23/2011    Past Surgical History:  Procedure Laterality Date  . [place] SURGERY  2008  . BUNIONECTOMY  2003  . SLEEVE GASTROPLASTY  04/23/2011    Family History  Problem Relation Age of Onset  . Hypertension Mother   . Diabetes Mother   . Diabetes Brother     1  . Multiple sclerosis Brother     1-Deceased    Social History   Social History  . Marital status: Married    Spouse name: N/A  . Number of children: N/A  . Years of education: N/A   Occupational History  . Not on file.   Social History Main Topics  . Smoking status: Former Smoker    Types: Cigarettes    Quit date: 10/17/1984  . Smokeless tobacco: Never Used  . Alcohol use 0.0 oz/week     Comment: socially  . Drug use: No  . Sexual activity: Yes    Partners: Male   Other Topics Concern  . Not on file   Social History Narrative  . No narrative on file     Current Outpatient Prescriptions:  .  amLODipine (NORVASC) 2.5 MG tablet, Take 1 tablet (2.5 mg total) by mouth daily., Disp: 90 tablet, Rfl: 1 .  aspirin 81 MG tablet, Take 81 mg by mouth daily., Disp: ,  Rfl:  .  atorvastatin (LIPITOR) 20 MG tablet, Take 1 tablet (20 mg total) by mouth daily., Disp: 90 tablet, Rfl: 1 .  cholecalciferol (VITAMIN D) 1000 UNITS tablet, Take 1 tablet (1,000 Units total) by mouth daily., Disp: 30 tablet, Rfl: 0 .  febuxostat (ULORIC) 40 MG tablet, Take 1 tablet (40 mg total) by mouth daily., Disp: 90 tablet, Rfl: 1 .  NEXIUM 40 MG capsule, Take 1 capsule (40 mg total) by mouth 2 (two) times daily., Disp: 60 capsule, Rfl: 11 .  spironolactone (ALDACTONE) 50 MG tablet,  Take 1 tablet (50 mg total) by mouth daily., Disp: 90 tablet, Rfl: 1  Allergies  Allergen Reactions  . Allopurinol      ROS  Constitutional: Negative for fever or weight change.  Respiratory: Negative for cough and shortness of breath.   Cardiovascular: Negative for chest pain or palpitations.  Gastrointestinal: Negative for abdominal pain, no bowel changes.  Musculoskeletal: Negative for gait problem or joint swelling.  Skin: Negative for rash.  Neurological: Negative for dizziness or headache.  No other specific complaints in a complete review of systems (except as listed in HPI above).   Objective  Vitals:   10/19/15 0842  BP: 134/86  Pulse: 71  Resp: 16  Temp: 98.3 F (36.8 C)  TempSrc: Oral  SpO2: 98%  Weight: 259 lb 3.2 oz (117.6 kg)  Height: 5\' 6"  (1.676 m)    Body mass index is 41.84 kg/m.  Physical Exam  Constitutional: Patient appears well-developed and obese . No distress.  HENT: Head: Normocephalic and atraumatic. Ears: B TMs ok, no erythema or effusion; Nose: Nose normal. Mouth/Throat: Oropharynx is clear and moist. No oropharyngeal exudate.  Eyes: Conjunctivae and EOM are normal. Pupils are equal, round, and reactive to light. No scleral icterus.  Neck: Normal range of motion. Neck supple. No JVD present. No thyromegaly present.  Cardiovascular: Normal rate, regular rhythm and normal heart sounds.  No murmur heard. No BLE edema. Pulmonary/Chest: Effort normal and breath sounds normal. No respiratory distress. Abdominal: Soft. Bowel sounds are normal, no distension. There is no tenderness. no masses Breast: no lumps or masses, no nipple discharge or rashes FEMALE GENITALIA:  External genitalia normal External urethra normal Not done RECTAL:not done Musculoskeletal: Normal range of motion, no joint effusions. No gross deformities Neurological: he is alert and oriented to person, place, and time. No cranial nerve deficit. Coordination, balance,  strength, speech and gait are normal.  Skin: Skin is warm and dry. No rash noted. No erythema.  Psychiatric: Patient has a normal mood and affect. behavior is normal. Judgment and thought content normal.  PHQ2/9: Depression screen [facility] 2/9 10/19/2015 07/14/2015 04/08/2015 12/20/2014 10/18/2014  Decreased Interest 0 0 0 0 0  Down, Depressed, Hopeless 0 0 0 0 0  PHQ - 2 Score 0 0 0 0 0     Fall Risk: Fall Risk  10/19/2015 07/14/2015 04/08/2015 12/20/2014 10/18/2014  Falls in the past year? Yes Yes Yes No No  Number falls in past yr: 1 1 2  or more - -  Injury with Fall? No No No - -     Functional Status Survey: Is the patient deaf or have difficulty hearing?: No Does the patient have difficulty seeing, even when wearing glasses/contacts?: No Does the patient have difficulty concentrating, remembering, or making decisions?: No Does the patient have difficulty walking or climbing stairs?: No Does the patient have difficulty dressing or bathing?: No Does the patient have difficulty doing errands alone  such as visiting a doctor's office or shopping?: No  Assessment & Plan  1. Medicare annual wellness visit, subsequent  Discussed importance of 150 minutes of physical activity weekly, eat two servings of fish weekly, eat one serving of tree nuts ( cashews, pistachios, pecans, almonds.Marland Kitchen) every other day, eat 6 servings of fruit/vegetables daily and drink plenty of water and avoid sweet beverages.   2. Needs flu shot  - Flu Vaccine QUAD 36+ mos PF IM (Fluarix & Fluzone Quad PF)  3. Need for pneumococcal vaccination  - Pneumococcal conjugate vaccine 13-valent IM  5. Controlled gout  - febuxostat (ULORIC) 40 MG tablet; Take 1 tablet (40 mg total) by mouth daily.  Dispense: 90 tablet; Refill: 1  6. Hypertension, benign  - spironolactone (ALDACTONE) 50 MG tablet; Take 1 tablet (50 mg total) by mouth daily.  Dispense: 90 tablet; Refill: 1 - amLODipine (NORVASC) 2.5 MG tablet; Take 1 tablet  (2.5 mg total) by mouth daily.  Dispense: 90 tablet; Refill: 1  7. Hyperlipidemia  - atorvastatin (LIPITOR) 20 MG tablet; Take 1 tablet (20 mg total) by mouth daily.  Dispense: 90 tablet; Refill: 1  8. Encounter for screening mammogram for breast cancer  - MM Digital Screening; Future

## 2015-10-26 MED ORDER — SPIRONOLACTONE 50 MG PO TABS: 50.0000 mg | ORAL_TABLET | Freq: Every day | ORAL | 1 refills | Status: DC

## 2015-11-06 ENCOUNTER — Encounter: Payer: Self-pay | Admitting: Family Medicine

## 2015-11-08 ENCOUNTER — Encounter: Payer: Self-pay | Admitting: Family Medicine

## 2015-11-09 ENCOUNTER — Encounter: Payer: Self-pay | Admitting: Family Medicine

## 2016-04-06 ENCOUNTER — Telehealth: Payer: Self-pay | Admitting: Gastroenterology

## 2016-04-06 ENCOUNTER — Other Ambulatory Visit: Payer: Self-pay

## 2016-04-06 MED ORDER — ESOMEPRAZOLE MAGNESIUM 40 MG PO CPDR: 40.0000 mg | DELAYED_RELEASE_CAPSULE | Freq: Two times a day (BID) | ORAL | 0 refills | Status: DC

## 2016-04-06 MED ORDER — NEXIUM 40 MG PO CPDR: 40.0000 mg | DELAYED_RELEASE_CAPSULE | Freq: Two times a day (BID) | ORAL | 0 refills | Status: DC

## 2016-04-06 NOTE — Telephone Encounter (Signed)
Rx's sent to pt's pharmacy per her request. Pt will need follow up appt before more refills will be authorized.

## 2016-04-06 NOTE — Telephone Encounter (Signed)
Patient would like for you to call in a 30 supply of Nexium DR 40mg  to [facility] on [address] and then call into her mail order Phone [phone] Fax [phone]  I will put in her new insurance from [facility]

## 2016-04-19 ENCOUNTER — Ambulatory Visit: Payer: Medicare Other | Admitting: Family Medicine

## 2016-04-20 ENCOUNTER — Other Ambulatory Visit: Payer: Self-pay | Admitting: Family Medicine

## 2016-04-20 DIAGNOSIS — E785 Hyperlipidemia, unspecified: Secondary | ICD-10-CM

## 2016-04-20 DIAGNOSIS — M109 Gout, unspecified: Secondary | ICD-10-CM

## 2016-04-20 DIAGNOSIS — I10 Essential (primary) hypertension: Secondary | ICD-10-CM

## 2016-04-20 NOTE — Telephone Encounter (Signed)
Patient was a no show for appointment yesterday and will need another appointment until a refill can be done. Will call enough in until her appointment, please call and schedule medication refill. Thanks

## 2016-04-20 NOTE — Telephone Encounter (Signed)
LMOM for pt to call office to schedule an appt.

## 2016-05-08 ENCOUNTER — Other Ambulatory Visit: Payer: Self-pay | Admitting: Gastroenterology

## 2016-05-28 ENCOUNTER — Telehealth: Payer: Self-pay | Admitting: Gastroenterology

## 2016-05-28 ENCOUNTER — Other Ambulatory Visit: Payer: Self-pay

## 2016-05-28 MED ORDER — NEXIUM 40 MG PO CPDR: 40.0000 mg | DELAYED_RELEASE_CAPSULE | Freq: Two times a day (BID) | ORAL | 0 refills | Status: DC

## 2016-05-28 NOTE — Telephone Encounter (Signed)
Patient needs a refill on Nexium with no substitutions per insurance. To do this call [phone]. After, please call the patient so she knows.

## 2016-05-28 NOTE — Telephone Encounter (Signed)
Pt notified rx for nexium was sent to [facility] per her request.

## 2016-05-29 ENCOUNTER — Encounter: Payer: Self-pay | Admitting: Family Medicine

## 2016-05-29 ENCOUNTER — Ambulatory Visit (INDEPENDENT_AMBULATORY_CARE_PROVIDER_SITE_OTHER): Payer: Medicare Other | Admitting: Family Medicine

## 2016-05-29 VITALS — BP 128/80 | HR 66 | Temp 98.1°F | Resp 16 | Ht 66.0 in | Wt 261.2 lb

## 2016-05-29 DIAGNOSIS — R87820 Cervical low risk human papillomavirus (HPV) DNA test positive: Secondary | ICD-10-CM

## 2016-05-29 DIAGNOSIS — M109 Gout, unspecified: Secondary | ICD-10-CM

## 2016-05-29 DIAGNOSIS — G4733 Obstructive sleep apnea (adult) (pediatric): Secondary | ICD-10-CM | POA: Diagnosis not present

## 2016-05-29 DIAGNOSIS — Z9989 Dependence on other enabling machines and devices: Secondary | ICD-10-CM

## 2016-05-29 DIAGNOSIS — K2270 Barrett's esophagus without dysplasia: Secondary | ICD-10-CM | POA: Diagnosis not present

## 2016-05-29 DIAGNOSIS — Z9884 Bariatric surgery status: Secondary | ICD-10-CM

## 2016-05-29 DIAGNOSIS — I10 Essential (primary) hypertension: Secondary | ICD-10-CM

## 2016-05-29 NOTE — Addendum Note (Signed)
Addended by: Lolita Rieger D on: 05/29/2016 02:14 PM   Modules accepted: Orders

## 2016-05-29 NOTE — Patient Instructions (Signed)
GLP-1 agonists, to help with weight loss and glucose level Examples are : Trulicity, Victoza, Ozempic, Bydureon

## 2016-05-29 NOTE — Progress Notes (Addendum)
Name: Shannon Petty   MRN: [id number]    DOB: 10-21-1950   Date:05/29/2016       Progress Note  Subjective  Chief Complaint  Chief Complaint  Patient presents with  . Hypertension    follow up  . Obesity  . Gastroesophageal Reflux    HPI  HTN: she was taking bp medication daily, but she forgot to take medications over the past couple of days .she denies SOB, chest pain or palpitation. No side effects.  No dizziness, she will monitor bp at home she is not sure if bp is dropping with medication  Hyperlipidemia: she taking Atorvastatin a few times a week, also taking aspirin 81 mg daily. Denies chest pain.   Gout: she states she has not have a gout attacks in years, taking Uloric, she has not been compliant with medication. Last uric acid was at goal and we will not recheck it.   Vertigo : resolves since she went ENT she had the Epley maneuver  Obesity: she had gastric band in 2008 and sleeve in 2013 by Dr. Darnell Level, she is not sure of how much weight she gained since surgery. She went to see Dr. Darnell Level today and had labs done, they went through her diet and discussed possible duodenal switch  Patient Active Problem List   Diagnosis Date Noted  . Barrett esophagus 10/18/2014  . Morbid obesity ([place]) 10/18/2014  . Chronic eczema 10/18/2014  . GERD (gastroesophageal reflux disease) 10/18/2014  . Hypertension, benign 10/18/2014  . Vitamin D deficiency 10/18/2014  . Controlled gout 10/18/2014  . Hyperlipidemia 10/18/2014  . Venous insufficiency 10/18/2014  . OSA on CPAP 10/18/2014  . 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 History of bariatric surgery 04/23/2011    Past Surgical History:  Procedure Laterality Date  . BUNIONECTOMY  2003  . LAPAROSCOPIC GASTRIC BANDING  2008  . SLEEVE GASTROPLASTY  04/23/2011    Family History  Problem Relation Age of Onset  . Hypertension Mother   . Diabetes Mother   . Diabetes Brother     1  . Multiple  sclerosis Brother     1-Deceased    Social History   Social History  . Marital status: Married    Spouse name: N/A  . Number of children: N/A  . Years of education: N/A   Occupational History  . Not on file.   Social History Main Topics  . Smoking status: Former Smoker    Types: Cigarettes    Quit date: 10/17/1984  . Smokeless tobacco: Never Used  . Alcohol use 0.0 oz/week     Comment: socially  . Drug use: No  . Sexual activity: Yes    Partners: Male   Other Topics Concern  . Not on file   Social History Narrative  . No narrative on file     Current Outpatient Prescriptions:  .  amLODipine (NORVASC) 2.5 MG tablet, Take 1 tablet (2.5 mg total) by mouth daily., Disp: 90 tablet, Rfl: 1 .  aspirin 81 MG tablet, Take 81 mg by mouth daily., Disp: , Rfl:  .  atorvastatin (LIPITOR) 20 MG tablet, Take 1 tablet (20 mg total) by mouth daily., Disp: 90 tablet, Rfl: 1 .  cholecalciferol (VITAMIN D) 1000 UNITS tablet, Take 1 tablet (1,000 Units total) by mouth daily., Disp: 30 tablet, Rfl: 0 .  febuxostat (ULORIC) 40 MG tablet, Take 1 tablet (40 mg total) by mouth daily., Disp: 90 tablet, Rfl: 1 .  NEXIUM 40 MG capsule, Take 1 capsule (40 mg total) by mouth 2 (two) times daily., Disp: 180 capsule, Rfl: 0 .  spironolactone (ALDACTONE) 50 MG tablet, Take 1 tablet (50 mg total) by mouth daily., Disp: 90 tablet, Rfl: 1  Allergies  Allergen Reactions  . Allopurinol      ROS  Constitutional: Negative for fever or weight change.  Respiratory: Negative for cough and shortness of breath.   Cardiovascular: Negative for chest pain or palpitations.  Gastrointestinal: Negative for abdominal pain, no bowel changes.  Musculoskeletal: Negative for gait problem or joint swelling.  Skin: Negative for rash.  Neurological: Negative for dizziness or headache.  No other specific complaints in a complete review of systems (except as listed in HPI above).  Objective  Vitals:   05/29/16 1342   BP: 128/80  Pulse: 66  Resp: 16  Temp: 98.1 F (36.7 C)  SpO2: 92%  Weight: 261 lb 4 oz (118.5 kg)  Height: 5\' 6"  (1.676 m)    Body mass index is 42.17 kg/m.  Physical Exam  Constitutional: Patient appears well-developed and well-nourished. Obese  No distress.  HEENT: head atraumatic, normocephalic, pupils equal and reactive to light,  neck supple, throat within normal limits Cardiovascular: Normal rate, regular rhythm and normal heart sounds.  No murmur heard. Trace  BLE edema. Pulmonary/Chest: Effort normal and breath sounds normal. No respiratory distress. Abdominal: Soft.  There is no tenderness. Psychiatric: Patient has a normal mood and affect. behavior is normal. Judgment and thought content normal.  PHQ2/9: Depression screen [facility] 2/9 10/19/2015 07/14/2015 04/08/2015 12/20/2014 10/18/2014  Decreased Interest 0 0 0 0 0  Down, Depressed, Hopeless 0 0 0 0 0  PHQ - 2 Score 0 0 0 0 0     Fall Risk: Fall Risk  10/19/2015 07/14/2015 04/08/2015 12/20/2014 10/18/2014  Falls in the past year? Yes Yes Yes No No  Number falls in past yr: 1 1 2  or more - -  Injury with Fall? No No No - -     Assessment & Plan  1. Hypertension, benign  Well controlled, she is changing pharmacy and will call back when she decides if she will use mailorder  2. Controlled gout  No symptoms taking Uloric, had labs done today by Dr. Darnell Level and will fax me the results  3. OSA on CPAP  Continue CPAP use every night  4. History of bariatric surgery  See below   5. Morbid obesity ([place])  Seen by Dr. Darnell Level this morning, and she failed bariatric surgery but thinking about another revision, discussed GLP1 agonists instead sine hgbA1C was high in the past, but she wants to think about it. She denies family history of thyroid cancer or MEN she denies personal history of pancreatitis  6. Barrett's esophagus without dysplasia  Continue follow up with Dr. Durwin Reges   7. Cervical low risk human  papillomavirus (HPV) DNA test positive   she will need repeat pap smear

## 2016-06-01 ENCOUNTER — Telehealth: Payer: Self-pay | Admitting: Obstetrics & Gynecology

## 2016-06-01 NOTE — Telephone Encounter (Signed)
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 Left voicemail for pt to call back to schedule

## 2016-06-04 NOTE — Telephone Encounter (Signed)
Pt is schedule 06/26/16 with Dr. Glennon Mac

## 2016-06-26 ENCOUNTER — Encounter: Payer: Self-pay | Admitting: Obstetrics and Gynecology

## 2016-06-26 ENCOUNTER — Ambulatory Visit (INDEPENDENT_AMBULATORY_CARE_PROVIDER_SITE_OTHER): Payer: Medicare Other | Admitting: Obstetrics and Gynecology

## 2016-06-26 VITALS — BP 130/80 | HR 59 | Ht 66.0 in | Wt 265.0 lb

## 2016-06-26 DIAGNOSIS — B977 Papillomavirus as the cause of diseases classified elsewhere: Secondary | ICD-10-CM

## 2016-06-26 DIAGNOSIS — R87619 Unspecified abnormal cytological findings in specimens from cervix uteri: Secondary | ICD-10-CM

## 2016-06-26 DIAGNOSIS — N72 Inflammatory disease of cervix uteri: Secondary | ICD-10-CM | POA: Diagnosis not present

## 2016-06-26 NOTE — Progress Notes (Addendum)
Obstetrics & Gynecology Office Visit   Chief Complaint:  Chief Complaint  Patient presents with  . Abnormal Pap Smear    History of Present Illness: 66 y.o. female with pap smear history of negative pap smear with positive high risk HPV in 08/2014. She is sent to see me today by her PCP, Dr. Steele Sizer for this follow up. She has no pelvic complaints today.  Denies vaginal bleeding    Review of Systems: Review of Systems  Constitutional: Negative.   HENT: Negative.   Eyes: Negative.   Respiratory: Negative.   Cardiovascular: Negative.   Gastrointestinal: Negative.   Genitourinary: Negative.   Musculoskeletal: Negative.   Skin: Negative.   Neurological: Negative.   Psychiatric/Behavioral: Negative.     Past Medical History:  Past Medical History:  Diagnosis Date  . Benign paroxysmal positional vertigo    unspecified laterality  . Controlled gout   . Eczema   . History of laparoscopic adjustable gastric banding   . Hyperlipidemia   . Hypertension   . Obesity   . OSA (obstructive sleep apnea)   . Venous insufficiency of both lower extremities   . Vitamin D deficiency     Past Surgical History:  Past Surgical History:  Procedure Laterality Date  . BUNIONECTOMY  2003  . LAPAROSCOPIC GASTRIC BANDING  2008  . SLEEVE GASTROPLASTY  04/23/2011    Gynecologic History: No LMP recorded. Patient is postmenopausal.   Family History  Problem Relation Age of Onset  . Hypertension Mother   . Diabetes Mother   . Diabetes Brother        1  . Multiple sclerosis Brother        1-Deceased    Social History:  Social History   Social History  . Marital status: Married    Spouse name: N/A  . Number of children: N/A  . Years of education: N/A   Occupational History  . Not on file.   Social History Main Topics  . Smoking status: Former Smoker    Types: Cigarettes    Quit date: 10/17/1984  . Smokeless tobacco: Never Used  . Alcohol use 0.0 oz/week     Comment:  socially  . Drug use: No  . Sexual activity: Yes    Partners: Male   Other Topics Concern  . Not on file   Social History Narrative  . No narrative on file    Allergies:  Allergies  Allergen Reactions  . Allopurinol     Medications: Prior to Admission medications   Medication Sig Start Date End Date Taking? Authorizing Provider  amLODipine (NORVASC) 2.5 MG tablet Take 1 tablet (2.5 mg total) by mouth daily. 10/19/15  Yes Steele Sizer, MD  aspirin 81 MG tablet Take 81 mg by mouth daily.   Yes [provider]  atorvastatin (LIPITOR) 20 MG tablet Take 1 tablet (20 mg total) by mouth daily. 10/19/15  Yes Sowles, Drue Stager, MD  cholecalciferol (VITAMIN D) 1000 UNITS tablet Take 1 tablet (1,000 Units total) by mouth daily. 10/18/14  Yes Sowles, Drue Stager, MD  febuxostat (ULORIC) 40 MG tablet Take 1 tablet (40 mg total) by mouth daily. 10/19/15  Yes Sowles, Drue Stager, MD  NEXIUM 40 MG capsule Take 1 capsule (40 mg total) by mouth 2 (two) times daily. 05/28/16  Yes Lucilla Lame, MD  spironolactone (ALDACTONE) 50 MG tablet Take 1 tablet (50 mg total) by mouth daily. 10/26/15  Yes Steele Sizer, MD    Physical Exam BP 130/80   Pulse Marland Kitchen)  59   Ht 5\' 6"  (1.676 m)   Wt 265 lb (120.2 kg)   BMI 42.77 kg/m  No LMP recorded. Patient is postmenopausal. Physical Exam  Constitutional: She is oriented to person, place, and time and well-developed, well-nourished, and in no distress. No distress.  HENT:  Head: Normocephalic and atraumatic.  Pulmonary/Chest: Effort normal and breath sounds normal. No respiratory distress.  Abdominal: Soft. Bowel sounds are normal. She exhibits no distension. There is no tenderness. There is no rebound.  Neurological: She is alert and oriented to person, place, and time. No cranial nerve deficit.  Psychiatric: Mood, affect and judgment normal.    Female chaperone present for pelvic and breast  portions of the physical exam  Assessment: 66 y.o. 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  Here for repeat pap smear and ongoing management.   Plan: Repeat pap smear today and plan management from there.  May need colposcopy, if still positive for HPV.  Prentice Docker, MD 06/26/2016 10:13 AM   CC: Steele Sizer, MD [id number] [place], [place] [address]    ADDENDUM: Pap smear returned as normal, but HPV is positive again. Will schedule colposcopy. Patient aware. Will forward results with recommendations once that is complete to Dr.Sowles.

## 2016-07-02 LAB — IGP, APTIMA HPV, RFX 16/18,45
HPV Aptima: POSITIVE — AB
HPV Genotype 16: NEGATIVE
HPV Genotype 18,45: NEGATIVE
PAP Smear Comment: 0

## 2016-07-09 ENCOUNTER — Telehealth: Payer: Self-pay | Admitting: Obstetrics and Gynecology

## 2016-07-09 NOTE — Telephone Encounter (Signed)
Left generic VM. Patient had +HPV on pap smear. Will need colposcopy.  SHe is not yet aware.

## 2016-07-10 NOTE — Telephone Encounter (Signed)
Pt is calling back due to missed call. Please call patient. Pt aware Dr. Glennon Mac at the [facility]  this morning.

## 2016-07-10 NOTE — Telephone Encounter (Signed)
Pt is calling about missed call

## 2016-07-10 NOTE — Telephone Encounter (Signed)
Spoke w patient. Explained need for colposcopy. She understands that risk of cervical cancer comes from HPV and that we need to get an idea of her risk of developing cervical cancer. SHe voiced understanding and will call to schedule.

## 2016-07-11 NOTE — Telephone Encounter (Signed)
Pt is schedule 07/31/16

## 2016-07-18 ENCOUNTER — Other Ambulatory Visit: Payer: Self-pay | Admitting: Gastroenterology

## 2016-07-31 ENCOUNTER — Ambulatory Visit (INDEPENDENT_AMBULATORY_CARE_PROVIDER_SITE_OTHER): Payer: Medicare Other | Admitting: Obstetrics and Gynecology

## 2016-07-31 ENCOUNTER — Encounter: Payer: Self-pay | Admitting: Obstetrics and Gynecology

## 2016-07-31 VITALS — BP 134/88 | Ht 65.0 in | Wt 266.0 lb

## 2016-07-31 DIAGNOSIS — R8789 Other abnormal findings in specimens from female genital organs: Secondary | ICD-10-CM

## 2016-07-31 DIAGNOSIS — R87618 Other abnormal cytological findings on specimens from cervix uteri: Secondary | ICD-10-CM

## 2016-07-31 NOTE — Progress Notes (Addendum)
Referring Provider:  Dr. Steele Sizer  HPI:  Shannon Petty is a 66 y.o.  who presents today for evaluation and management of abnormal cervical cytology.    Dysplasia History:  Positive HPV abnormal pap smear since 2015.     OB History  No data available    Past Medical History:  Diagnosis Date  . Benign paroxysmal positional vertigo    unspecified laterality  . Controlled gout   . Eczema   . History of laparoscopic adjustable gastric banding   . Hyperlipidemia   . Hypertension   . Obesity   . OSA (obstructive sleep apnea)   . Venous insufficiency of both lower extremities   . Vitamin D deficiency     Past Surgical History:  Procedure Laterality Date  . BUNIONECTOMY  2003  . LAPAROSCOPIC GASTRIC BANDING  2008  . SLEEVE GASTROPLASTY  04/23/2011    SOCIAL HISTORY:  History  Alcohol Use  . 0.0 oz/week    Comment: socially    History  Drug Use No     Family History  Problem Relation Age of Onset  . Hypertension Mother   . Diabetes Mother   . Diabetes Brother        1  . Multiple sclerosis Brother        1-Deceased    ALLERGIES:  Allopurinol  Current Outpatient Prescriptions on File Prior to Visit  Medication Sig Dispense Refill  . amLODipine (NORVASC) 2.5 MG tablet Take 1 tablet (2.5 mg total) by mouth daily. 90 tablet 1  . aspirin 81 MG tablet Take 81 mg by mouth daily.    Marland Kitchen atorvastatin (LIPITOR) 20 MG tablet Take 1 tablet (20 mg total) by mouth daily. 90 tablet 1  . cholecalciferol (VITAMIN D) 1000 UNITS tablet Take 1 tablet (1,000 Units total) by mouth daily. 30 tablet 0  . febuxostat (ULORIC) 40 MG tablet Take 1 tablet (40 mg total) by mouth daily. 90 tablet 1  . NEXIUM 40 MG capsule TAKE 1 CAPSULE BY MOUTH TWO TIMES DAILY 180 capsule 0  . spironolactone (ALDACTONE) 50 MG tablet Take 1 tablet (50 mg total) by mouth daily. 90 tablet 1   No current facility-administered medications on file prior to visit.     Physical Exam: -Vitals:  BP  134/88   Ht 5\' 5"  (1.651 m)   Wt 266 lb (120.7 kg)   BMI 44.26 kg/m  GEN: WD, WN, NAD.  A+ O x 3, good mood and affect. ABD:  NT, ND.  Soft, no masses.  No hernias noted.   Pelvic:   Vulva: Normal appearance.  No lesions.  Vagina: No lesions or abnormalities noted.  Support: Normal pelvic support.  Urethra No masses tenderness or scarring.  Meatus Normal size without lesions or prolapse.  Cervix: See below.  Anus: Normal exam.  No lesions.  Perineum: Normal exam.  No lesions.        Bimanual   Uterus: Normal size.  Non-tender.  Mobile.  AV.  Adnexae: No masses.  Non-tender to palpation.  Cul-de-sac: Negative for abnormality.   PROCEDURE: 1.  Urine Pregnancy Test:  not done due to menopausal status 2.  Colposcopy performed with 4% acetic acid after verbal consent obtained                                         -Aceto-white Lesions Location(s): generally anteriorly  and right right side posteriorly              -Biopsy performed at 2, 8, and 10 o'clock               -ECC indicated and performed: Yes.       -Biopsy sites made hemostatic with pressure and/or Monsel's solution   -Satisfactory colposcopy: No.    -Evidence of Invasive cervical CA :  NO  ASSESSMENT:  Shannon Petty is a 66 y.o. female here for  1. 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  PLAN: I discussed the grading system of pap smears and HPV high risk viral types.  We will discuss and base management after colpo results return.     Prentice Docker, MD  [facility] 08/01/2016  1:58 PM   ADDENDUM: Discussed normal results with patient. Recommend follow up pap smear in one year. Prentice Docker, MD 08/08/2016 11:54 AM     CC: Steele Sizer, MD [address] [place],  [id number]

## 2016-08-02 LAB — PATHOLOGY

## 2016-08-08 ENCOUNTER — Telehealth: Payer: Self-pay | Admitting: Obstetrics and Gynecology

## 2016-08-08 ENCOUNTER — Encounter: Payer: Self-pay | Admitting: Obstetrics and Gynecology

## 2016-08-08 NOTE — Telephone Encounter (Signed)
Spoke with patient and gave results. All questions answered.  Discussed that it is very important that the patient have a follow up pap smear in one year.  She voiced understanding and agreement.

## 2016-10-01 ENCOUNTER — Ambulatory Visit (INDEPENDENT_AMBULATORY_CARE_PROVIDER_SITE_OTHER): Payer: Medicare Other

## 2016-10-01 VITALS — BP 142/86 | HR 64 | Temp 98.0°F | Ht 65.0 in | Wt 261.7 lb

## 2016-10-01 DIAGNOSIS — Z23 Encounter for immunization: Secondary | ICD-10-CM

## 2016-10-01 DIAGNOSIS — Z0000 Encounter for general adult medical examination without abnormal findings: Secondary | ICD-10-CM

## 2016-10-01 NOTE — Patient Instructions (Signed)
Ms. Shannon Petty , Thank you for taking time to come for your Medicare Wellness Visit. I appreciate your ongoing commitment to your health goals. Please review the following plan we discussed and let me know if I can assist you in the future.   Screening recommendations/referrals: Colonoscopy: up to date, due 02/2024 Mammogram: up to date, due 10/2016 Bone Density: up to date Recommended yearly ophthalmology/optometry visit for glaucoma screening and checkup Recommended yearly dental visit for hygiene and checkup  Vaccinations: Influenza vaccine: completed today Pneumococcal vaccine: completed series Tdap vaccine: up to date, due 10/2016 Shingles vaccine: completed 01/2010  Advanced directives: Please bring a copy of your POA (Power of [place]) and/or Living Will to your next appointment.   Conditions/risks identified: Fall risk prevention; Recommend increasing water intake to 4-6 glasses a day.   Next appointment: 12/07/16   Preventive Care 65 Years and Older, Female Preventive care refers to lifestyle choices and visits with your health care provider that can promote health and wellness. What does preventive care include?  A yearly physical exam. This is also called an annual well check.  Dental exams once or twice a year.  Routine eye exams. Ask your health care provider how often you should have your eyes checked.  Personal lifestyle choices, including:  Daily care of your teeth and gums.  Regular physical activity.  Eating a healthy diet.  Avoiding tobacco and drug use.  Limiting alcohol use.  Practicing safe sex.  Taking low-dose aspirin every day.  Taking vitamin and mineral supplements as recommended by your health care provider. What happens during an annual well check? The services and screenings done by your health care provider during your annual well check will depend on your age, overall health, lifestyle risk factors, and family history of  disease. Counseling  Your health care provider may ask you questions about your:  Alcohol use.  Tobacco use.  Drug use.  Emotional well-being.  Home and relationship well-being.  Sexual activity.  Eating habits.  History of falls.  Memory and ability to understand (cognition).  Work and work Statistician.  Reproductive health. Screening  You may have the following tests or measurements:  Height, weight, and BMI.  Blood pressure.  Lipid and cholesterol levels. These may be checked every 5 years, or more frequently if you are over 2 years old.  Skin check.  Lung cancer screening. You may have this screening every year starting at age 56 if you have a 30-pack-year history of smoking and currently smoke or have quit within the past 15 years.  Fecal occult blood test (FOBT) of the stool. You may have this test every year starting at age 75.  Flexible sigmoidoscopy or colonoscopy. You may have a sigmoidoscopy every 5 years or a colonoscopy every 10 years starting at age 24.  Hepatitis C blood test.  Hepatitis B blood test.  Sexually transmitted disease (STD) testing.  Diabetes screening. This is done by checking your blood sugar (glucose) after you have not eaten for a while (fasting). You may have this done every 1-3 years.  Bone density scan. This is done to screen for osteoporosis. You may have this done starting at age 90.  Mammogram. This may be done every 1-2 years. Talk to your health care provider about how often you should have regular mammograms. Talk with your health care provider about your test results, treatment options, and if necessary, the need for more tests. Vaccines  Your health care provider may recommend certain vaccines, such  as:  Influenza vaccine. This is recommended every year.  Tetanus, diphtheria, and acellular pertussis (Tdap, Td) vaccine. You may need a Td booster every 10 years.  Zoster vaccine. You may need this after age  22.  Pneumococcal 13-valent conjugate (PCV13) vaccine. One dose is recommended after age 10.  Pneumococcal polysaccharide (PPSV23) vaccine. One dose is recommended after age 84. Talk to your health care provider about which screenings and vaccines you need and how often you need them. This information is not intended to replace advice given to you by your health care provider. Make sure you discuss any questions you have with your health care provider. Document Released: 02/04/2015 Document Revised: 09/28/2015 Document Reviewed: 11/09/2014 Elsevier Interactive Patient Education  2017 [employer] Prevention in the Home Falls can cause injuries. They can happen to people of all ages. There are many things you can do to make your home safe and to help prevent falls. What can I do on the outside of my home?  Regularly fix the edges of walkways and driveways and fix any cracks.  Remove anything that might make you trip as you walk through a door, such as a raised step or threshold.  Trim any bushes or trees on the path to your home.  Use bright outdoor lighting.  Clear any walking paths of anything that might make someone trip, such as rocks or tools.  Regularly check to see if handrails are loose or broken. Make sure that both sides of any steps have handrails.  Any raised decks and porches should have guardrails on the edges.  Have any leaves, snow, or ice cleared regularly.  Use sand or salt on walking paths during winter.  Clean up any spills in your garage right away. This includes oil or grease spills. What can I do in the bathroom?  Use night lights.  Install grab bars by the toilet and in the tub and shower. Do not use towel bars as grab bars.  Use non-skid mats or decals in the tub or shower.  If you need to sit down in the shower, use a plastic, non-slip stool.  Keep the floor dry. Clean up any water that spills on the floor as soon as it happens.  Remove  soap buildup in the tub or shower regularly.  Attach bath mats securely with double-sided non-slip rug tape.  Do not have throw rugs and other things on the floor that can make you trip. What can I do in the bedroom?  Use night lights.  Make sure that you have a light by your bed that is easy to reach.  Do not use any sheets or blankets that are too big for your bed. They should not hang down onto the floor.  Have a firm chair that has side arms. You can use this for support while you get dressed.  Do not have throw rugs and other things on the floor that can make you trip. What can I do in the kitchen?  Clean up any spills right away.  Avoid walking on wet floors.  Keep items that you use a lot in easy-to-reach places.  If you need to reach something above you, use a strong step stool that has a grab bar.  Keep electrical cords out of the way.  Do not use floor polish or wax that makes floors slippery. If you must use wax, use non-skid floor wax.  Do not have throw rugs and other things on the  floor that can make you trip. What can I do with my stairs?  Do not leave any items on the stairs.  Make sure that there are handrails on both sides of the stairs and use them. Fix handrails that are broken or loose. Make sure that handrails are as long as the stairways.  Check any carpeting to make sure that it is firmly attached to the stairs. Fix any carpet that is loose or worn.  Avoid having throw rugs at the top or bottom of the stairs. If you do have throw rugs, attach them to the floor with carpet tape.  Make sure that you have a light switch at the top of the stairs and the bottom of the stairs. If you do not have them, ask someone to add them for you. What else can I do to help prevent falls?  Wear shoes that:  Do not have high heels.  Have rubber bottoms.  Are comfortable and fit you well.  Are closed at the toe. Do not wear sandals.  If you use a  stepladder:  Make sure that it is fully opened. Do not climb a closed stepladder.  Make sure that both sides of the stepladder are locked into place.  Ask someone to hold it for you, if possible.  Clearly mark and make sure that you can see:  Any grab bars or handrails.  First and last steps.  Where the edge of each step is.  Use tools that help you move around (mobility aids) if they are needed. These include:  Canes.  Walkers.  Scooters.  Crutches.  Turn on the lights when you go into a dark area. Replace any light bulbs as soon as they burn out.  Set up your furniture so you have a clear path. Avoid moving your furniture around.  If any of your floors are uneven, fix them.  If there are any pets around you, be aware of where they are.  Review your medicines with your doctor. Some medicines can make you feel dizzy. This can increase your chance of falling. Ask your doctor what other things that you can do to help prevent falls. This information is not intended to replace advice given to you by your health care provider. Make sure you discuss any questions you have with your health care provider. Document Released: 11/04/2008 Document Revised: 06/16/2015 Document Reviewed: 02/12/2014 Elsevier Interactive Patient Education  2017 [employer].

## 2016-10-01 NOTE — Progress Notes (Signed)
Subjective:   Shannon Petty is a 66 y.o. female who presents for Medicare Annual (Subsequent) preventive examination.  Review of Systems:  N/A  Cardiac Risk Factors include: advanced age (>51men, >62 women);dyslipidemia;hypertension;obesity (BMI >30kg/m2)     Objective:     Vitals: BP (!) 142/86 (BP Location: Left Arm)   Pulse 64   Temp 98 F (36.7 C) (Oral)   Ht 5\' 5"  (1.651 m)   Wt 261 lb 11.2 oz (118.7 kg)   BMI 43.55 kg/m   Body mass index is 43.55 kg/m.   Tobacco History  Smoking Status  . Former Smoker  . Types: Cigarettes  . Quit date: 10/17/1984  Smokeless Tobacco  . Never Used     Counseling given: Not Answered   Past Medical History:  Diagnosis Date  . Benign paroxysmal positional vertigo    unspecified laterality  . Controlled gout   . Eczema   . History of laparoscopic adjustable gastric banding   . Hyperlipidemia   . Hypertension   . Obesity   . OSA (obstructive sleep apnea)   . Venous insufficiency of both lower extremities   . Vitamin D deficiency    Past Surgical History:  Procedure Laterality Date  . BUNIONECTOMY  2003  . LAPAROSCOPIC GASTRIC BANDING  2008  . SLEEVE GASTROPLASTY  04/23/2011   Family History  Problem Relation Age of Onset  . Hypertension Mother   . Diabetes Mother   . Diabetes Brother        1  . Multiple sclerosis Brother        1-Deceased   History  Sexual Activity  . Sexual activity: Yes  . Partners: Male    Outpatient Encounter Prescriptions as of 10/01/2016  Medication Sig  . amLODipine (NORVASC) 2.5 MG tablet Take 1 tablet (2.5 mg total) by mouth daily.  Marland Kitchen aspirin 81 MG tablet Take 81 mg by mouth daily.  Marland Kitchen atorvastatin (LIPITOR) 20 MG tablet Take 1 tablet (20 mg total) by mouth daily.  . cholecalciferol (VITAMIN D) 1000 UNITS tablet Take 1 tablet (1,000 Units total) by mouth daily.  . febuxostat (ULORIC) 40 MG tablet Take 1 tablet (40 mg total) by mouth daily.  Marland Kitchen NEXIUM 40 MG capsule TAKE 1 CAPSULE  BY MOUTH TWO TIMES DAILY  . spironolactone (ALDACTONE) 50 MG tablet Take 1 tablet (50 mg total) by mouth daily.   No facility-administered encounter medications on file as of 10/01/2016.     Activities of Daily Living In your present state of health, do you have any difficulty performing the following activities: 10/01/2016 10/19/2015  Hearing? N N  Vision? Y N  Comment only without glasses -  Difficulty concentrating or making decisions? N N  Walking or climbing stairs? Y N  Comment climbing steps- knee stiffness -  Dressing or bathing? N N  Doing errands, shopping? N N  Preparing Food and eating ? N -  Using the Toilet? N -  In the past six months, have you accidently leaked urine? Y -  Comment only when sneezing or laughing -  Do you have problems with loss of bowel control? N -  Managing your Medications? N -  Managing your Finances? N -  Housekeeping or managing your Housekeeping? N -  Some recent data might be hidden    Patient Care Team: Steele Sizer, MD as PCP - General (Family Medicine) Bonner Puna, MD as Referring Physician (Specialist) [place], [place] as Consulting Physician [facility])  Assessment:     Exercise Activities and Dietary recommendations Current Exercise Habits: Structured exercise class (goes to [place]), Type of exercise: Other - see comments;walking (stationary bicycle and eliptical), Time (Minutes): 60, Frequency (Times/Week): 3, Weekly Exercise (Minutes/Week): 180, Intensity: Mild, Exercise limited by: Other - see comments (joint stiffness)  Goals    . Increase water intake          Recommend increasing water intake to 4-6 glasses a day.       Fall Risk Fall Risk  10/01/2016 10/19/2015 07/14/2015 04/08/2015 12/20/2014  Falls in the past year? Yes Yes Yes Yes No  Number falls in past yr: 1 1 1 2  or more -  Comment tripped over step - - - -  Injury with Fall? No No No No -  Comment - - - Just contusion on her right legs -  Follow up  Falls prevention discussed - - - -   Depression Screen PHQ 2/9 Scores 10/01/2016 10/19/2015 07/14/2015 04/08/2015  PHQ - 2 Score 0 0 0 0     Cognitive Function     6CIT Screen 10/01/2016  What Year? 0 points  What month? 0 points  What time? 0 points  Count back from 20 0 points  Months in reverse 0 points  Repeat phrase 0 points  Total Score 0    Immunization History  Administered Date(s) Administered  . Influenza, High Dose Seasonal PF 10/18/2014, 10/01/2016  . Influenza,inj,Quad PF,6+ Mos 10/19/2015  . Influenza-Unspecified 09/08/2013  . Pneumococcal Conjugate-13 10/19/2015  . Pneumococcal Polysaccharide-23 10/18/2014  . Tdap 10/30/2006  . Zoster 01/22/2010   Screening Tests Health Maintenance  Topic Date Due  . TETANUS/TDAP  10/29/2016  . COLONOSCOPY  02/25/2017  . MAMMOGRAM  11/08/2017  . INFLUENZA VACCINE  Completed  . DEXA SCAN  Completed  . Hepatitis C Screening  Completed  . PNA vac Low Risk Adult  Completed      Plan:  I have personally reviewed and addressed the Medicare Annual Wellness questionnaire and have noted the following in the patient's chart:  A. Medical and social history B. Use of alcohol, tobacco or illicit drugs  C. Current medications and supplements D. Functional ability and status E.  Nutritional status F.  Physical activity G. Advance directives H. List of other physicians I.  Hospitalizations, surgeries, and ER visits in previous 12 months J.  [place] such as hearing and vision if needed, cognitive and depression L. Referrals and appointments - none  In addition, I have reviewed and discussed with patient certain preventive protocols, quality metrics, and best practice recommendations. A written personalized care plan for preventive services as well as general preventive health recommendations were provided to patient.  See attached scanned questionnaire for additional information.   Signed,  Fabio Neighbors,  LPN Nurse Health Advisor   MD Recommendations: None. Pt to schedule physical with PCP today.  I have reviewed this encounter including the documentation in this note and/or discussed this patient with the provider, Fabio Neighbors, LPN. I am certifying that I agree with the content of this note as supervising physician.  Steele Sizer, MD [facility] 10/01/2016, 4:04 PM

## 2016-10-14 ENCOUNTER — Other Ambulatory Visit: Payer: Self-pay | Admitting: Gastroenterology

## 2016-12-07 ENCOUNTER — Ambulatory Visit: Payer: Self-pay | Admitting: Family Medicine

## 2016-12-11 ENCOUNTER — Telehealth: Payer: Self-pay | Admitting: Family Medicine

## 2016-12-11 NOTE — Telephone Encounter (Signed)
Patient has an appointment scheduled for medical clearance form completion tomorrow.

## 2016-12-11 NOTE — Telephone Encounter (Signed)
Copied from [place] [phone]. Topic: Quick Communication - See Telephone Encounter >> Dec 11, 2016  2:56 PM Robina Ade, Helene Kelp D wrote: CRM for notification. See Telephone encounter for: 12/11/16. Christan from [facility] called and would like the status of a medical clearance that was sent to the office last week. Please call her back [phone], thanks.

## 2016-12-12 ENCOUNTER — Encounter: Payer: Self-pay | Admitting: Family Medicine

## 2016-12-12 ENCOUNTER — Ambulatory Visit (INDEPENDENT_AMBULATORY_CARE_PROVIDER_SITE_OTHER): Payer: Medicare Other | Admitting: Family Medicine

## 2016-12-12 VITALS — BP 182/98 | HR 67 | Temp 98.2°F | Resp 18 | Ht 65.0 in | Wt 262.2 lb

## 2016-12-12 DIAGNOSIS — G4733 Obstructive sleep apnea (adult) (pediatric): Secondary | ICD-10-CM | POA: Diagnosis not present

## 2016-12-12 DIAGNOSIS — Z23 Encounter for immunization: Secondary | ICD-10-CM

## 2016-12-12 DIAGNOSIS — Z9989 Dependence on other enabling machines and devices: Secondary | ICD-10-CM

## 2016-12-12 DIAGNOSIS — E785 Hyperlipidemia, unspecified: Secondary | ICD-10-CM

## 2016-12-12 DIAGNOSIS — S61219A Laceration without foreign body of unspecified finger without damage to nail, initial encounter: Secondary | ICD-10-CM

## 2016-12-12 DIAGNOSIS — Z1231 Encounter for screening mammogram for malignant neoplasm of breast: Secondary | ICD-10-CM | POA: Diagnosis not present

## 2016-12-12 DIAGNOSIS — Z9884 Bariatric surgery status: Secondary | ICD-10-CM

## 2016-12-12 DIAGNOSIS — M109 Gout, unspecified: Secondary | ICD-10-CM

## 2016-12-12 DIAGNOSIS — R87810 Cervical high risk human papillomavirus (HPV) DNA test positive: Secondary | ICD-10-CM | POA: Diagnosis not present

## 2016-12-12 DIAGNOSIS — I10 Essential (primary) hypertension: Secondary | ICD-10-CM | POA: Diagnosis not present

## 2016-12-12 MED ORDER — SPIRONOLACTONE 50 MG PO TABS: 50.0000 mg | ORAL_TABLET | Freq: Every day | ORAL | 1 refills | Status: DC

## 2016-12-12 MED ORDER — AMLODIPINE BESYLATE 5 MG PO TABS: 5.0000 mg | ORAL_TABLET | Freq: Every day | ORAL | 0 refills | Status: DC

## 2016-12-12 MED ORDER — FEBUXOSTAT 40 MG PO TABS: 40.0000 mg | ORAL_TABLET | Freq: Every day | ORAL | 1 refills | Status: DC

## 2016-12-12 MED ORDER — ATORVASTATIN CALCIUM 20 MG PO TABS: 20.0000 mg | ORAL_TABLET | Freq: Every day | ORAL | 1 refills | Status: DC

## 2016-12-12 NOTE — Progress Notes (Signed)
Name: Shannon Petty   MRN: [id number]    DOB: [birth year]   Date:12/12/2016       Progress Note  Subjective  Chief Complaint  Chief Complaint  Patient presents with  . Hypertension  . Obesity  . Sleep Apnea    HPI    HTN: she has  not been very compliant with medications, she went out of town and has been without taking medications for the past 7  days .She denies SOB, chest pain or palpitation. No side effects.  No dizziness. BP is very high today and we will adjust dose and follow up in 2 days for bp check   Hyperlipidemia: she taking Atorvastatin a few times a week, also taking aspirin 81 mg daily. Denies chest pain.   Gout: she states she has not have a gout attacks in years, taking Uloric, she has not been compliant with medication. Last uric acid was at goal.   Obesity: she had gastric band in 2008 and sleeve in 2013 by Dr. Darnell Level. Maximum weight 275 lbs, today at 262 lbs, and weight has been stable for the past 6 months.   GERD: taking medication, has severe GERD, affecting her teeth, continue follow up with dentist and Dr. Durwin Reges.   OSA/CPAP: she is very compliant with CPAP machine. She wakes up without a headaches, no snoring and denies daytime fatigue.   Laceration finger ( ring right hand) : happened 3 days ago while on vacation, stuck hand insider of a zip lock bag and cut her hand, it bleed all over the bathroom floor. She is due for Tdap  Patient Active Problem List   Diagnosis Date Noted  . Barrett esophagus 10/18/2014  . Morbid obesity ([place]) 10/18/2014  . Chronic eczema 10/18/2014  . GERD (gastroesophageal reflux disease) 10/18/2014  . Hypertension, benign 10/18/2014  . Vitamin D deficiency 10/18/2014  . Controlled gout 10/18/2014  . Hyperlipidemia 10/18/2014  . Venous insufficiency 10/18/2014  . OSA on CPAP 10/18/2014  . 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 History of bariatric surgery 04/23/2011    Past Surgical  History:  Procedure Laterality Date  . BUNIONECTOMY  2003  . LAPAROSCOPIC GASTRIC BAND REMOVAL WITH LAPAROSCOPIC GASTRIC SLEEVE RESECTION    . LAPAROSCOPIC GASTRIC BANDING  2008  . SLEEVE GASTROPLASTY  04/23/2011    Family History  Problem Relation Age of Onset  . Hypertension Mother   . Diabetes Mother   . Diabetes Brother   . Multiple sclerosis Brother     Social History   Socioeconomic History  . Marital status: Married    Spouse name: Not on file  . Number of children: Not on file  . Years of education: Not on file  . Highest education level: Not on file  Social Needs  . Financial resource strain: Not on file  . Food insecurity - worry: Not on file  . Food insecurity - inability: Not on file  . Transportation needs - medical: Not on file  . Transportation needs - non-medical: Not on file  Occupational History  . Not on file  Tobacco Use  . Smoking status: Former Smoker    Packs/day: 0.50    Years: 8.00    Pack years: 4.00    Types: Cigarettes    Start date: 01/23/1976    Last attempt to quit: 10/17/1984    Years since quitting: 32.1  . Smokeless tobacco: Never Used  Substance and Sexual Activity  . Alcohol use: Yes  Alcohol/week: 0.0 oz    Comment: socially  . Drug use: No  . Sexual activity: Yes    Partners: Male    Birth control/protection: Post-menopausal  Other Topics Concern  . Not on file  Social History Narrative  . Not on file     Current Outpatient Medications:  .  amLODipine (NORVASC) 5 MG tablet, Take 1 tablet (5 mg total) by mouth daily., Disp: 90 tablet, Rfl: 0 .  aspirin 81 MG tablet, Take 81 mg by mouth daily., Disp: , Rfl:  .  atorvastatin (LIPITOR) 20 MG tablet, Take 1 tablet (20 mg total) by mouth daily., Disp: 90 tablet, Rfl: 1 .  Calcium Carb-Cholecalciferol (CALCIUM-VITAMIN D) 500-200 MG-UNIT tablet, Take by mouth., Disp: , Rfl:  .  cholecalciferol (VITAMIN D) 1000 UNITS tablet, Take 1 tablet (1,000 Units total) by mouth daily.,  Disp: 30 tablet, Rfl: 0 .  febuxostat (ULORIC) 40 MG tablet, Take 1 tablet (40 mg total) by mouth daily., Disp: 90 tablet, Rfl: 1 .  Multiple Vitamin (MULTI-VITAMINS) TABS, Take by mouth., Disp: , Rfl:  .  NEXIUM 40 MG capsule, TAKE 1 CAPSULE BY MOUTH TWO TIMES DAILY (Patient taking differently: TAKE 1 CAPSULE BY MOUTH TWO TIMES DAILY-BRAND ONLY), Disp: 180 capsule, Rfl: 0 .  spironolactone (ALDACTONE) 50 MG tablet, Take 1 tablet (50 mg total) by mouth daily., Disp: 90 tablet, Rfl: 1  Allergies  Allergen Reactions  . Other     Other reaction(s): Other (See Comments) Itchy throat  . Allopurinol Other (See Comments)    rash     ROS  Constitutional: Negative for fever , positive for weight change.  Respiratory: Negative for cough and shortness of breath.   Cardiovascular: Negative for chest pain or palpitations.  Gastrointestinal: Negative for abdominal pain, no bowel changes.  Musculoskeletal: Negative for gait problem or joint swelling.  Skin: Negative for rash.  Neurological: Negative for dizziness or headache.  No other specific complaints in a complete review of systems (except as listed in HPI above).   Objective  Vitals:   12/12/16 1417 12/12/16 1519  BP: (!) 208/100 (!) 182/98  Pulse: 67   Resp: 18   Temp: 98.2 F (36.8 C)   TempSrc: Oral   SpO2: 98%   Weight: 262 lb 3.2 oz (118.9 kg)   Height: 5\' 5"  (1.651 m)     Body mass index is 43.63 kg/m.  Physical Exam  Constitutional: Patient appears well-developed and well-nourished. Obese  No distress.  HEENT: head atraumatic, normocephalic, pupils equal and reactive to light,neck supple, throat within normal limits Cardiovascular: Normal rate, regular rhythm and normal heart sounds.  No murmur heard. Trace  BLE edema. Pulmonary/Chest: Effort normal and breath sounds normal. No respiratory distress. Abdominal: Soft.  There is no tenderness. Psychiatric: Patient has a normal mood and affect. behavior is normal.  Judgment and thought content normal. Neurological: no focal findings.   PHQ2/9: Depression screen [facility] 2/9 12/12/2016 10/01/2016 10/19/2015 07/14/2015 04/08/2015  Decreased Interest 0 0 0 0 0  Down, Depressed, Hopeless 0 0 0 0 0  PHQ - 2 Score 0 0 0 0 0     Fall Risk: Fall Risk  12/12/2016 10/01/2016 10/19/2015 07/14/2015 04/08/2015  Falls in the past year? No Yes Yes Yes Yes  Number falls in past yr: - 1 1 1 2  or more  Comment - tripped over step - - -  Injury with Fall? - No No No No  Comment - - - - Just contusion  on her right legs  Follow up - Falls prevention discussed - - -     Functional Status Survey: Is the patient deaf or have difficulty hearing?: No Does the patient have difficulty seeing, even when wearing glasses/contacts?: No Does the patient have difficulty concentrating, remembering, or making decisions?: No Does the patient have difficulty walking or climbing stairs?: No Does the patient have difficulty dressing or bathing?: No Does the patient have difficulty doing errands alone such as visiting a doctor's office or shopping?: No    Assessment & Plan  1. Uncontrolled hypertension  She has not been compliant with medication ( travelled and did not take medication for the past 7 days), bp is very high today and was high during follow up with Dr. Darnell Level was 148 SBP, we will adjust Norvasc to 5 mg daily  - amLODipine (NORVASC) 5 MG tablet; Take 1 tablet (5 mg total) by mouth daily.  Dispense: 90 tablet; Refill: 0 - spironolactone (ALDACTONE) 50 MG tablet; Take 1 tablet (50 mg total) by mouth daily.  Dispense: 90 tablet; Refill: 1  2. Need for diphtheria-tetanus-pertussis (Tdap) vaccine  - Tdap vaccine greater than or equal to 7yo IM  3. Controlled gout  - febuxostat (ULORIC) 40 MG tablet; Take 1 tablet (40 mg total) by mouth daily.  Dispense: 90 tablet; Refill: 1  4. OSA on CPAP  She is compliant with CPAP   5. History of bariatric surgery  Still sees Dr.  Darnell Level and had labs within the past few months we will get a copy  6. Morbid obesity ([place])   7. Laceration of skin of finger, initial encounter  3 days ago, we will give her a Tdap   8. Cervical high risk human papillomavirus (HPV) DNA test positive  Had CPE done at [facility]  9. Encounter for screening mammogram for breast cancer  - MM Digital Screening; Future  10. Hyperlipidemia  - atorvastatin (LIPITOR) 20 MG tablet; Take 1 tablet (20 mg total) by mouth daily.  Dispense: 90 tablet; Refill: 1

## 2016-12-14 ENCOUNTER — Ambulatory Visit (INDEPENDENT_AMBULATORY_CARE_PROVIDER_SITE_OTHER): Payer: Medicare Other | Admitting: Emergency Medicine

## 2016-12-14 VITALS — BP 134/78

## 2016-12-14 DIAGNOSIS — I10 Essential (primary) hypertension: Secondary | ICD-10-CM

## 2016-12-19 NOTE — Progress Notes (Signed)
BP check - nurse visit

## 2016-12-20 NOTE — Telephone Encounter (Signed)
Copied from [place]. Topic: Referral - Question >> Dec 19, 2016  2:33 PM Conception Chancy, NT wrote: Reason for CRM: Erasmo Downer is calling from bariatric specialist and is needing the clearance that was sent over. States she can be reached at [phone]. Please call back

## 2016-12-21 NOTE — Telephone Encounter (Signed)
I did not do a pre-op clearance, she needs to be seen Monday

## 2016-12-21 NOTE — Telephone Encounter (Signed)
Patient is coming in Monday at 8:40 for this Pre-Op and states her BP has been running better since taking her medications. Bariatric Specialist sent a new paper for Dr. Ancil Boozer to fill out for her surgery.

## 2016-12-21 NOTE — Telephone Encounter (Signed)
Copied from [place]. Topic: Quick Communication - See Telephone Encounter >> Dec 20, 2016 10:10 AM Hewitt Shorts wrote: CRM for notification. See Telephone encounter for: the bariatic clinic is calling about the form they are needing for surgical clearance today   Best number [phone] extension [phone]   12/20/16.

## 2016-12-21 NOTE — Telephone Encounter (Signed)
Spoke with Erasmo Downer and she will fax sheet back over for surgery clearance. I informed her Dr. Ancil Boozer is not in the office until Monday and will have to wait for her to clear the patient since she came back in and her BP had improved since she started back taking her medication, but did not know if this meant she was clear for surgery. Would discuss with Dr. Ancil Boozer Monday morning and call her back then.

## 2016-12-24 ENCOUNTER — Ambulatory Visit: Payer: Medicare Other | Admitting: Family Medicine

## 2016-12-24 ENCOUNTER — Encounter: Payer: Self-pay | Admitting: Family Medicine

## 2016-12-24 DIAGNOSIS — I10 Essential (primary) hypertension: Secondary | ICD-10-CM

## 2016-12-24 DIAGNOSIS — Z01818 Encounter for other preprocedural examination: Secondary | ICD-10-CM | POA: Diagnosis not present

## 2016-12-24 NOTE — Progress Notes (Signed)
Name: Shannon Petty   MRN: [id number]    DOB: 1950/03/26   Date:12/24/2016       Progress Note  Subjective  Chief Complaint  Chief Complaint  Patient presents with  . Pre-op Exam  . Hypertension    HPI  Pre-op: she had a sleeve procedure done in 2013 and is going back for a revision by Dr. Darnell Level soon. Her max weight was 275 lbs she went down to around 230 lbs, but gaining it back. She has gone to the classes and feels ready for the surgery. She was seen last week and bp was very high but is back at goal. She denies decrease in exercise tolerance, no SOB or chest pain. She never had complications from previous anesthesia. She lives with her husband who will help her in the post-op. She is currently exercising 3 times a week, and will follow up with dietician afterwards.    Patient Active Problem List   Diagnosis Date Noted  . Barrett esophagus 10/18/2014  . Morbid obesity ([place]) 10/18/2014  . Chronic eczema 10/18/2014  . GERD (gastroesophageal reflux disease) 10/18/2014  . Hypertension, benign 10/18/2014  . Vitamin D deficiency 10/18/2014  . Controlled gout 10/18/2014  . Hyperlipidemia 10/18/2014  . Venous insufficiency 10/18/2014  . OSA on CPAP 10/18/2014  . 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 History of bariatric surgery 04/23/2011    Past Surgical History:  Procedure Laterality Date  . BUNIONECTOMY  2003  . LAPAROSCOPIC GASTRIC BAND REMOVAL WITH LAPAROSCOPIC GASTRIC SLEEVE RESECTION    . LAPAROSCOPIC GASTRIC BANDING  2008  . SLEEVE GASTROPLASTY  04/23/2011    Family History  Problem Relation Age of Onset  . Hypertension Mother   . Diabetes Mother   . Diabetes Brother   . Multiple sclerosis Brother     Social History   Socioeconomic History  . Marital status: Married    Spouse name: Not on file  . Number of children: Not on file  . Years of education: Not on file  . Highest education level: Not on file  Social Needs  .  Financial resource strain: Not on file  . Food insecurity - worry: Not on file  . Food insecurity - inability: Not on file  . Transportation needs - medical: Not on file  . Transportation needs - non-medical: Not on file  Occupational History  . Not on file  Tobacco Use  . Smoking status: Former Smoker    Packs/day: 0.50    Years: 8.00    Pack years: 4.00    Types: Cigarettes    Start date: 01/23/1976    Last attempt to quit: 10/17/1984    Years since quitting: 32.2  . Smokeless tobacco: Never Used  Substance and Sexual Activity  . Alcohol use: Yes    Alcohol/week: 0.0 oz    Comment: socially  . Drug use: No  . Sexual activity: Yes    Partners: Male    Birth control/protection: Post-menopausal  Other Topics Concern  . Not on file  Social History Narrative  . Not on file     Current Outpatient Medications:  .  amLODipine (NORVASC) 5 MG tablet, Take 1 tablet (5 mg total) by mouth daily., Disp: 90 tablet, Rfl: 0 .  aspirin 81 MG tablet, Take 81 mg by mouth daily., Disp: , Rfl:  .  atorvastatin (LIPITOR) 20 MG tablet, Take 1 tablet (20 mg total) by mouth daily., Disp: 90 tablet, Rfl: 1 .  Calcium Carb-Cholecalciferol (CALCIUM-VITAMIN D) 500-200 MG-UNIT tablet, Take by mouth., Disp: , Rfl:  .  cholecalciferol (VITAMIN D) 1000 UNITS tablet, Take 1 tablet (1,000 Units total) by mouth daily., Disp: 30 tablet, Rfl: 0 .  febuxostat (ULORIC) 40 MG tablet, Take 1 tablet (40 mg total) by mouth daily., Disp: 90 tablet, Rfl: 1 .  Multiple Vitamin (MULTI-VITAMINS) TABS, Take by mouth., Disp: , Rfl:  .  NEXIUM 40 MG capsule, TAKE 1 CAPSULE BY MOUTH TWO TIMES DAILY (Patient taking differently: TAKE 1 CAPSULE BY MOUTH TWO TIMES DAILY-BRAND ONLY), Disp: 180 capsule, Rfl: 0 .  spironolactone (ALDACTONE) 50 MG tablet, Take 1 tablet (50 mg total) by mouth daily., Disp: 90 tablet, Rfl: 1  Allergies  Allergen Reactions  . Other     Other reaction(s): Other (See Comments) Itchy throat  .  Allopurinol Other (See Comments)    rash     ROS  Constitutional: Negative for fever or weight change.  Respiratory: Negative for cough and shortness of breath.   Cardiovascular: Negative for chest pain or palpitations.  Gastrointestinal: Negative for abdominal pain, no bowel changes.  Musculoskeletal: Negative for gait problem or joint swelling.  Skin: Negative for rash.  Neurological: Negative for dizziness or headache.  No other specific complaints in a complete review of systems (except as listed in HPI above).  Objective  Vitals:   12/24/16 0849 12/24/16 0850  BP: (!) 142/66 130/90  Pulse: 87 89  Resp: 14   SpO2: 98%   Weight: 261 lb 6.4 oz (118.6 kg)   Height: 5' 5.25" (1.657 m)     Body mass index is 43.17 kg/m.  Physical Exam  Constitutional: Patient appears well-developed and well-nourished. Obese  No distress.  HEENT: head atraumatic, normocephalic, pupils equal and reactive to light, neck supple, throat within normal limits Cardiovascular: Normal rate, regular rhythm and normal heart sounds.  No murmur heard. No BLE edema. Pulmonary/Chest: Effort normal and breath sounds normal. No respiratory distress. Abdominal: Soft.  There is no tenderness. Psychiatric: Patient has a normal mood and affect. behavior is normal. Judgment and thought content normal.   PHQ2/9: Depression screen [facility] 2/9 12/12/2016 10/01/2016 10/19/2015 07/14/2015 04/08/2015  Decreased Interest 0 0 0 0 0  Down, Depressed, Hopeless 0 0 0 0 0  PHQ - 2 Score 0 0 0 0 0     Fall Risk: Fall Risk  12/24/2016 12/12/2016 10/01/2016 10/19/2015 07/14/2015  Falls in the past year? No No Yes Yes Yes  Number falls in past yr: - - 1 1 1   Comment - - tripped over step - -  Injury with Fall? - - No No No  Comment - - - - -  Follow up - - Falls prevention discussed - -    Functional Status Survey: Is the patient deaf or have difficulty hearing?: No Does the patient have difficulty seeing, even when wearing  glasses/contacts?: No Does the patient have difficulty concentrating, remembering, or making decisions?: No Does the patient have difficulty walking or climbing stairs?: No Does the patient have difficulty dressing or bathing?: No Does the patient have difficulty doing errands alone such as visiting a doctor's office or shopping?: No    Assessment & Plan  1. Morbid obesity ([place])  Going to have bariatric surgery / revision   2. Hypertension, benign  EKG done 09/28/2016 at [facility]  3. Pre-op evaluation  May proceed without further testing

## 2016-12-25 ENCOUNTER — Ambulatory Visit: Payer: Medicare Other | Admitting: Family Medicine

## 2016-12-26 ENCOUNTER — Ambulatory Visit: Payer: Medicare Other | Admitting: Family Medicine

## 2017-01-11 DIAGNOSIS — K8020 Calculus of gallbladder without cholecystitis without obstruction: Secondary | ICD-10-CM

## 2017-01-11 HISTORY — DX: Calculus of gallbladder without cholecystitis without obstruction: K80.20

## 2017-01-30 ENCOUNTER — Other Ambulatory Visit: Payer: Self-pay | Admitting: Family Medicine

## 2017-01-30 DIAGNOSIS — I10 Essential (primary) hypertension: Secondary | ICD-10-CM

## 2017-01-30 NOTE — Telephone Encounter (Signed)
Hypertension medication request: Amlodipine to [facility]  Last office visit pertaining to hypertension: 12/24/2016  BP Readings from Last 3 Encounters:  12/24/16 130/90  12/14/16 134/78  12/12/16 (!) 182/98    Lab Results  Component Value Date   CREATININE 0.91 02/28/2015   BUN 14 02/28/2015   NA 142 02/28/2015   K 4.6 02/28/2015   CL 104 02/28/2015   CO2 23 02/28/2015     Follow up on 04/11/2017

## 2017-01-31 HISTORY — PX: CHOLECYSTECTOMY, LAPAROSCOPIC: SHX56

## 2017-01-31 HISTORY — PX: LAPAROSCOPIC GASTRIC RESTRICTIVE DUODENAL PROCEDURE (DUODENAL SWITCH): SHX6667

## 2017-04-11 ENCOUNTER — Encounter: Payer: Self-pay | Admitting: Family Medicine

## 2017-04-11 ENCOUNTER — Ambulatory Visit: Payer: Medicare Other | Admitting: Family Medicine

## 2017-04-11 VITALS — BP 122/84 | HR 62 | Temp 98.3°F | Resp 16 | Ht 65.0 in | Wt 235.7 lb

## 2017-04-11 DIAGNOSIS — M109 Gout, unspecified: Secondary | ICD-10-CM | POA: Diagnosis not present

## 2017-04-11 DIAGNOSIS — Z9989 Dependence on other enabling machines and devices: Secondary | ICD-10-CM | POA: Diagnosis not present

## 2017-04-11 DIAGNOSIS — E559 Vitamin D deficiency, unspecified: Secondary | ICD-10-CM | POA: Diagnosis not present

## 2017-04-11 DIAGNOSIS — K2270 Barrett's esophagus without dysplasia: Secondary | ICD-10-CM

## 2017-04-11 DIAGNOSIS — Z9884 Bariatric surgery status: Secondary | ICD-10-CM | POA: Diagnosis not present

## 2017-04-11 DIAGNOSIS — I10 Essential (primary) hypertension: Secondary | ICD-10-CM

## 2017-04-11 DIAGNOSIS — E785 Hyperlipidemia, unspecified: Secondary | ICD-10-CM | POA: Diagnosis not present

## 2017-04-11 DIAGNOSIS — G4733 Obstructive sleep apnea (adult) (pediatric): Secondary | ICD-10-CM

## 2017-04-11 DIAGNOSIS — D72829 Elevated white blood cell count, unspecified: Secondary | ICD-10-CM

## 2017-04-11 DIAGNOSIS — Z6841 Body Mass Index (BMI) 40.0 and over, adult: Secondary | ICD-10-CM | POA: Insufficient documentation

## 2017-04-11 MED ORDER — AMLODIPINE BESYLATE 5 MG PO TABS: 5.0000 mg | ORAL_TABLET | Freq: Every day | ORAL | 0 refills | Status: DC

## 2017-04-11 NOTE — Progress Notes (Signed)
Name: Shannon Petty   MRN: [id number]    DOB: 1950-10-13   Date:04/11/2017       Progress Note  Subjective  Chief Complaint  Chief Complaint  Patient presents with  . Hypertension    3 month follow up  . Hyperlipidemia    HPI   HTN: taking medication as prescribed, bp is at goal today, no chest pain or palpitation.   Hyperlipidemia: shetaking Atorvastatin a few times a week, may stop aspirin based on new studies.. Denies chest pain.  Gout: she states she has not have a gout attacks in years, taking Uloric, she has not been compliant with medication. Last uric acid was at goal. We will continue to monitor.   Obesity: she had gastric band in 2008 and sleeve in 2013 by Dr. Darnell Level. Maximum weight 275 lbs, she had another procedure, duodenal switch on 01/31/2017 her weight was 259 lbs that day and today is down to 235.7lbs. No problems since surgery, feeling well, but would like to see a dietician but insurance is not covering.   GERD/Barrett's esophagus: taking medication occasionally since gastric switch, advised her to contact Dr. Allen Norris.   OSA/CPAP: she is very compliant with CPAP machine. She wakes up without a headaches, no snoring and denies daytime fatigue. She as been losing weight since 01/2017 after gastric switch surgery and explained that pressure may change and to let us know if she feels like the machine is blowing too much air.     Patient Active Problem List   Diagnosis Date Noted  . BMI 40.0-44.9, adult ([place]) 04/11/2017  . Barrett esophagus 10/18/2014  . Morbid obesity ([place]) 10/18/2014  . Chronic eczema 10/18/2014  . GERD (gastroesophageal reflux disease) 10/18/2014  . Hypertension, benign 10/18/2014  . Vitamin D deficiency 10/18/2014  . Controlled gout 10/18/2014  . Hyperlipidemia 10/18/2014  . Venous insufficiency 10/18/2014  . OSA on CPAP 10/18/2014  . 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 History of bariatric  surgery 04/23/2011    Past Surgical History:  Procedure Laterality Date  . BUNIONECTOMY  2003  . CHOLECYSTECTOMY, LAPAROSCOPIC  01/31/2017  . LAPAROSCOPIC GASTRIC BAND REMOVAL WITH LAPAROSCOPIC GASTRIC SLEEVE RESECTION  04/23/2011  . LAPAROSCOPIC GASTRIC BANDING  2008  . LAPAROSCOPIC GASTRIC RESTRICTIVE DUODENAL PROCEDURE (DUODENAL SWITCH)  01/31/2017    Family History  Problem Relation Age of Onset  . Hypertension Mother   . Diabetes Mother   . Diabetes Brother   . Multiple sclerosis Brother     Social History   Socioeconomic History  . Marital status: Married    Spouse name: Not on file  . Number of children: Not on file  . Years of education: Not on file  . Highest education level: Not on file  Occupational History  . Not on file  Social Needs  . Financial resource strain: Not on file  . Food insecurity:    Worry: Not on file    Inability: Not on file  . Transportation needs:    Medical: Not on file    Non-medical: Not on file  Tobacco Use  . Smoking status: Former Smoker    Packs/day: 0.50    Years: 8.00    Pack years: 4.00    Types: Cigarettes    Start date: 01/23/1976    Last attempt to quit: 10/17/1984    Years since quitting: 32.5  . Smokeless tobacco: Never Used  Substance and Sexual Activity  . Alcohol use: Yes  Alcohol/week: 0.0 oz    Comment: socially  . Drug use: No  . Sexual activity: Yes    Partners: Male    Birth control/protection: Post-menopausal  Lifestyle  . Physical activity:    Days per week: Not on file    Minutes per session: Not on file  . Stress: Not on file  Relationships  . Social connections:    Talks on phone: Not on file    Gets together: Not on file    Attends religious service: Not on file    Active member of club or organization: Not on file    Attends meetings of clubs or organizations: Not on file    Relationship status: Not on file  . Intimate partner violence:    Fear of current or ex partner: Not on file     Emotionally abused: Not on file    Physically abused: Not on file    Forced sexual activity: Not on file  Other Topics Concern  . Not on file  Social History Narrative  . Not on file     Current Outpatient Medications:  .  amLODipine (NORVASC) 5 MG tablet, TAKE 1 TABLET BY MOUTH  DAILY, Disp: 90 tablet, Rfl: 0 .  atorvastatin (LIPITOR) 20 MG tablet, Take 1 tablet (20 mg total) by mouth daily., Disp: 90 tablet, Rfl: 1 .  Calcium Carb-Cholecalciferol (CALCIUM-VITAMIN D) 500-200 MG-UNIT tablet, Take by mouth., Disp: , Rfl:  .  cholecalciferol (VITAMIN D) 1000 UNITS tablet, Take 1 tablet (1,000 Units total) by mouth daily., Disp: 30 tablet, Rfl: 0 .  febuxostat (ULORIC) 40 MG tablet, Take 1 tablet (40 mg total) by mouth daily., Disp: 90 tablet, Rfl: 1 .  Multiple Vitamin (MULTI-VITAMINS) TABS, Take by mouth., Disp: , Rfl:  .  NEXIUM 40 MG capsule, TAKE 1 CAPSULE BY MOUTH TWO TIMES DAILY (Patient taking differently: TAKE 1 CAPSULE BY MOUTH TWO TIMES DAILY-BRAND ONLY), Disp: 180 capsule, Rfl: 0 .  spironolactone (ALDACTONE) 50 MG tablet, Take 1 tablet (50 mg total) by mouth daily., Disp: 90 tablet, Rfl: 1 .  aspirin 81 MG tablet, Take 81 mg by mouth daily., Disp: , Rfl:   Allergies  Allergen Reactions  . Pistachio Nut Extract Skin Test   . Allopurinol Other (See Comments)    rash     ROS  Constitutional: Negative for fever, positive for  weight change.  Respiratory: Negative for cough and shortness of breath.   Cardiovascular: Negative for chest pain or palpitations.  Gastrointestinal: Negative for abdominal pain, no bowel changes.  Musculoskeletal: Negative for gait problem or joint swelling.  Skin: Negative for rash.  Neurological: Negative for dizziness or headache.  No other specific complaints in a complete review of systems (except as listed in HPI above).  Objective  Vitals:   04/11/17 0800  BP: 122/84  Pulse: 62  Resp: 16  Temp: 98.3 F (36.8 C)  TempSrc: Oral   SpO2: 94%  Weight: 235 lb 11.2 oz (106.9 kg)  Height: 5\' 5"  (1.651 m)    Body mass index is 39.22 kg/m.  Physical Exam  Constitutional: Patient appears well-developed and well-nourished. Obese  No distress.  HEENT: head atraumatic, normocephalic, pupils equal and reactive to light,neck supple, throat within normal limits Cardiovascular: Normal rate, regular rhythm and normal heart sounds.  No murmur heard. No BLE edema. Pulmonary/Chest: Effort normal and breath sounds normal. No respiratory distress. Abdominal: Soft.  There is no tenderness. Psychiatric: Patient has a normal mood and affect. behavior  is normal. Judgment and thought content normal.  PHQ2/9: Depression screen [facility] 2/9 12/12/2016 10/01/2016 10/19/2015 07/14/2015 04/08/2015  Decreased Interest 0 0 0 0 0  Down, Depressed, Hopeless 0 0 0 0 0  PHQ - 2 Score 0 0 0 0 0     Fall Risk: Fall Risk  12/24/2016 12/12/2016 10/01/2016 10/19/2015 07/14/2015  Falls in the past year? No No Yes Yes Yes  Number falls in past yr: - - 1 1 1   Comment - - tripped over step - -  Injury with Fall? - - No No No  Comment - - - - -  Follow up - - Falls prevention discussed - -     Assessment & Plan  1. Hypertension, benign  - amLODipine (NORVASC) 5 MG tablet; Take 1 tablet (5 mg total) by mouth daily.  Dispense: 90 tablet; Refill: 0 - COMPLETE METABOLIC PANEL WITH GFR  2. Controlled gout  Continue allopurinol   3. Morbid obesity ([place])  She had multiple weight loss surgeries, last one 01/31/2017 and has lost 24 lbs since   4. OSA on CPAP  Still wearing a CPAP machine  5. History of bariatric surgery   6. Dyslipidemia  - Lipid panel  7. Barrett's esophagus without dysplasia  On PPI   8. Leukocytosis, unspecified type  - CBC with Differential/Platelet  9. Vitamin D deficiency  - VITAMIN D 25 Hydroxy (Vit-D Deficiency, Fractures)

## 2017-04-11 NOTE — Patient Instructions (Signed)

## 2017-04-12 LAB — CBC WITH DIFFERENTIAL/PLATELET
BASOS ABS: 61 {cells}/uL (ref 0–200)
Basophils Relative: 0.8 %
EOS ABS: 213 {cells}/uL (ref 15–500)
Eosinophils Relative: 2.8 %
HEMATOCRIT: 38.8 % (ref 35.0–45.0)
Hemoglobin: 12.8 g/dL (ref 11.7–15.5)
LYMPHS ABS: 1702 {cells}/uL (ref 850–3900)
MCH: 30 pg (ref 27.0–33.0)
MCHC: 33 g/dL (ref 32.0–36.0)
MCV: 91.1 fL (ref 80.0–100.0)
MPV: 9.7 fL (ref 7.5–12.5)
Monocytes Relative: 5.4 %
Neutro Abs: 5214 cells/uL (ref 1500–7800)
Neutrophils Relative %: 68.6 %
PLATELETS: 355 10*3/uL (ref 140–400)
RBC: 4.26 10*6/uL (ref 3.80–5.10)
RDW: 15 % (ref 11.0–15.0)
TOTAL LYMPHOCYTE: 22.4 %
WBC: 7.6 10*3/uL (ref 3.8–10.8)
WBCMIX: 410 {cells}/uL (ref 200–950)

## 2017-04-12 LAB — LIPID PANEL
CHOLESTEROL: 149 mg/dL (ref <200)
HDL: 57 mg/dL (ref >50)
LDL Cholesterol (Calc): 68 mg/dL (calc)
Non-HDL Cholesterol (Calc): 92 mg/dL (calc) (ref <130)
TRIGLYCERIDES: 153 mg/dL — AB (ref <150)
Total CHOL/HDL Ratio: 2.6 (calc) (ref <5.0)

## 2017-04-12 LAB — COMPLETE METABOLIC PANEL WITH GFR
AG Ratio: 1.6 (calc) (ref 1.0–2.5)
ALBUMIN MSPROF: 3.9 g/dL (ref 3.6–5.1)
ALT: 17 U/L (ref 6–29)
AST: 20 U/L (ref 10–35)
Alkaline phosphatase (APISO): 113 U/L (ref 33–130)
BUN: 11 mg/dL (ref 7–25)
CO2: 30 mmol/L (ref 20–32)
CREATININE: 0.86 mg/dL (ref 0.50–0.99)
Calcium: 8.8 mg/dL (ref 8.6–10.4)
Chloride: 107 mmol/L (ref 98–110)
GFR, EST AFRICAN AMERICAN: 81 mL/min/{1.73_m2} (ref >=60)
GFR, Est Non African American: 70 mL/min/{1.73_m2} (ref >=60)
GLOBULIN: 2.4 g/dL (ref 1.9–3.7)
Glucose, Bld: 96 mg/dL (ref 65–99)
Potassium: 3.3 mmol/L — ABNORMAL LOW (ref 3.5–5.3)
SODIUM: 144 mmol/L (ref 135–146)
TOTAL PROTEIN: 6.3 g/dL (ref 6.1–8.1)
Total Bilirubin: 0.8 mg/dL (ref 0.2–1.2)

## 2017-04-12 LAB — VITAMIN D 25 HYDROXY (VIT D DEFICIENCY, FRACTURES): Vit D, 25-Hydroxy: 34 ng/mL (ref 30–100)

## 2017-07-11 ENCOUNTER — Other Ambulatory Visit: Payer: Self-pay | Admitting: Family Medicine

## 2017-07-11 DIAGNOSIS — I10 Essential (primary) hypertension: Secondary | ICD-10-CM

## 2017-07-11 NOTE — Telephone Encounter (Signed)
Refill request for Hypertension medication:  Amlodipine 5 mg  Last office visit pertaining to hypertension: 04/11/2017  BP Readings from Last 3 Encounters:  04/11/17 122/84  12/24/16 130/90  12/14/16 134/78     Lab Results  Component Value Date   CREATININE 0.86 04/11/2017   BUN 11 04/11/2017   NA 144 04/11/2017   K 3.3 (L) 04/11/2017   CL 107 04/11/2017   CO2 30 04/11/2017   Follow-ups on file. 08/26/2017

## 2017-07-12 ENCOUNTER — Other Ambulatory Visit: Payer: Self-pay | Admitting: Family Medicine

## 2017-07-12 DIAGNOSIS — I10 Essential (primary) hypertension: Secondary | ICD-10-CM

## 2017-07-15 ENCOUNTER — Other Ambulatory Visit: Payer: Self-pay | Admitting: Family Medicine

## 2017-07-15 DIAGNOSIS — I10 Essential (primary) hypertension: Secondary | ICD-10-CM

## 2017-07-19 ENCOUNTER — Other Ambulatory Visit: Payer: Self-pay

## 2017-07-19 DIAGNOSIS — I10 Essential (primary) hypertension: Secondary | ICD-10-CM

## 2017-07-19 MED ORDER — AMLODIPINE BESYLATE 5 MG PO TABS: 5.0000 mg | ORAL_TABLET | Freq: Every day | ORAL | 0 refills | Status: DC

## 2017-07-19 NOTE — Telephone Encounter (Signed)
Refill request was sent to Dr. Krichna Sowles for approval and submission.  

## 2017-08-14 ENCOUNTER — Other Ambulatory Visit: Payer: Self-pay

## 2017-08-14 ENCOUNTER — Telehealth: Payer: Self-pay | Admitting: Gastroenterology

## 2017-08-14 MED ORDER — NEXIUM 40 MG PO CPDR: DELAYED_RELEASE_CAPSULE | ORAL | 0 refills | Status: DC

## 2017-08-14 NOTE — Telephone Encounter (Signed)
Pt is calling to schedule Fu apt with Dr. Allen Norris to get refill on rx Nexium to  Mail order please no Generic pt needs Nexium. Pt is scheduled for Sept 24th

## 2017-08-14 NOTE — Telephone Encounter (Signed)
Refill for Nexium has been sent to pt's mail order per her request.

## 2017-08-26 ENCOUNTER — Ambulatory Visit (INDEPENDENT_AMBULATORY_CARE_PROVIDER_SITE_OTHER): Payer: Medicare Other | Admitting: Family Medicine

## 2017-08-26 ENCOUNTER — Encounter: Payer: Self-pay | Admitting: Family Medicine

## 2017-08-26 DIAGNOSIS — Z9884 Bariatric surgery status: Secondary | ICD-10-CM | POA: Diagnosis not present

## 2017-08-26 DIAGNOSIS — M109 Gout, unspecified: Secondary | ICD-10-CM

## 2017-08-26 DIAGNOSIS — E559 Vitamin D deficiency, unspecified: Secondary | ICD-10-CM

## 2017-08-26 DIAGNOSIS — Z9989 Dependence on other enabling machines and devices: Secondary | ICD-10-CM

## 2017-08-26 DIAGNOSIS — K2270 Barrett's esophagus without dysplasia: Secondary | ICD-10-CM

## 2017-08-26 DIAGNOSIS — E785 Hyperlipidemia, unspecified: Secondary | ICD-10-CM

## 2017-08-26 DIAGNOSIS — G4733 Obstructive sleep apnea (adult) (pediatric): Secondary | ICD-10-CM | POA: Diagnosis not present

## 2017-08-26 DIAGNOSIS — I10 Essential (primary) hypertension: Secondary | ICD-10-CM

## 2017-08-26 NOTE — Progress Notes (Signed)
Name: Shannon Petty   MRN: [id number]    DOB: 06-02-1950   Date:08/26/2017       Progress Note  Subjective  Chief Complaint  Chief Complaint  Patient presents with  . Medication Refill  . Hypertension  . Hyperlipidemia  . Gout  . Bariatric Surgery    HPI   HTN: she states she has not been compliant with medication lately. BP was very high during visit with bariatric center 180/90 in July. Today bp 162/90. She states over the past week she has skipped doses. She does not want to adjust medication at this time. She will return on Friday for bp check only  Hyperlipidemia: shetaking Atorvastatin a few times a week, last LDL is at goal, she stopped aspirin, but ASCVD is very high and advised to resume aspirin 81 mg chewable and get bp under control . Denies chest pain.  Gout: she states she has not have a gout attacks in years, taking Uloric, she has not been compliant with medication. Last uric acid was at goal.Unchanged.   Obesity: she had gastric band in 2008 and sleeve in 2013 by Dr. Darnell Level. Maximum weight 275 lbs, she had another procedure, duodenal switch on 01/31/2017 her weight was 259 lbs that day and today is down to223.6 lbs. No problems since surgery, feeling well. She states she will pay for nutritional advise since insurance does not pay for.   GERD/Barrett's esophagus: she ran out of Nexium in July and had severe pain and regurgitation. Dr. Durwin Reges sent a refill and she is feeling better now, she has an appointment September for EGD and colonoscopy   OSA/CPAP: she is very compliant with CPAP machine. She wakes up without a headaches, no snoring and denies daytime fatigue. She as been losing weight since 01/2017 after gastric switch surgery and explained that pressure may change, she has not gone for repeat sleep study yet.     Patient Active Problem List   Diagnosis Date Noted  . Barrett esophagus 10/18/2014  . Morbid obesity ([place]) 10/18/2014  . Chronic eczema  10/18/2014  . GERD (gastroesophageal reflux disease) 10/18/2014  . Hypertension, benign 10/18/2014  . Vitamin D deficiency 10/18/2014  . Controlled gout 10/18/2014  . Hyperlipidemia 10/18/2014  . Venous insufficiency 10/18/2014  . OSA on CPAP 10/18/2014  . 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 History of bariatric surgery 04/23/2011    Past Surgical History:  Procedure Laterality Date  . BUNIONECTOMY  2003  . CHOLECYSTECTOMY, LAPAROSCOPIC  01/31/2017  . LAPAROSCOPIC GASTRIC BAND REMOVAL WITH LAPAROSCOPIC GASTRIC SLEEVE RESECTION  04/23/2011  . LAPAROSCOPIC GASTRIC BANDING  2008  . LAPAROSCOPIC GASTRIC RESTRICTIVE DUODENAL PROCEDURE (DUODENAL SWITCH)  01/31/2017    Family History  Problem Relation Age of Onset  . Hypertension Mother   . Diabetes Mother   . Diabetes Brother   . Multiple sclerosis Brother     Social History   Socioeconomic History  . Marital status: Married    Spouse name: Not on file  . Number of children: Not on file  . Years of education: Not on file  . Highest education level: Not on file  Occupational History  . Not on file  Social Needs  . Financial resource strain: Not on file  . Food insecurity:    Worry: Not on file    Inability: Not on file  . Transportation needs:    Medical: Not on file    Non-medical: Not on file  Tobacco  Use  . Smoking status: Former Smoker    Packs/day: 0.50    Years: 8.00    Pack years: 4.00    Types: Cigarettes    Start date: 01/23/1976    Last attempt to quit: 10/17/1984    Years since quitting: 32.8  . Smokeless tobacco: Never Used  Substance and Sexual Activity  . Alcohol use: Yes    Alcohol/week: 0.0 oz    Comment: socially  . Drug use: No  . Sexual activity: Yes    Partners: Male    Birth control/protection: Post-menopausal  Lifestyle  . Physical activity:    Days per week: Not on file    Minutes per session: Not on file  . Stress: Not on file  Relationships   . Social connections:    Talks on phone: Not on file    Gets together: Not on file    Attends religious service: Not on file    Active member of club or organization: Not on file    Attends meetings of clubs or organizations: Not on file    Relationship status: Not on file  . Intimate partner violence:    Fear of current or ex partner: Not on file    Emotionally abused: Not on file    Physically abused: Not on file    Forced sexual activity: Not on file  Other Topics Concern  . Not on file  Social History Narrative  . Not on file     Current Outpatient Medications:  .  amLODipine (NORVASC) 5 MG tablet, Take 1 tablet (5 mg total) by mouth daily., Disp: 90 tablet, Rfl: 0 .  atorvastatin (LIPITOR) 20 MG tablet, Take 1 tablet (20 mg total) by mouth daily., Disp: 90 tablet, Rfl: 1 .  Calcium Carb-Cholecalciferol (CALCIUM-VITAMIN D) 500-200 MG-UNIT tablet, Take by mouth., Disp: , Rfl:  .  cholecalciferol (VITAMIN D) 1000 UNITS tablet, Take 1 tablet (1,000 Units total) by mouth daily., Disp: 30 tablet, Rfl: 0 .  febuxostat (ULORIC) 40 MG tablet, Take 1 tablet (40 mg total) by mouth daily., Disp: 90 tablet, Rfl: 1 .  Multiple Vitamin (MULTI-VITAMINS) TABS, Take by mouth., Disp: , Rfl:  .  NEXIUM 40 MG capsule, TAKE 1 CAPSULE BY MOUTH TWO TIMES DAILY-BRAND ONLY, Disp: 180 capsule, Rfl: 0 .  spironolactone (ALDACTONE) 50 MG tablet, Take 1 tablet (50 mg total) by mouth daily., Disp: 90 tablet, Rfl: 1  Allergies  Allergen Reactions  . Pistachio Nut Extract Skin Test   . Allopurinol Other (See Comments)    rash     ROS  Constitutional: Negative for fever , positive for weight change.  Respiratory: Negative for cough and shortness of breath.   Cardiovascular: Negative for chest pain or palpitations.  Gastrointestinal: Negative for abdominal pain, no bowel changes.  Musculoskeletal: Negative for gait problem or joint swelling.  Skin: Negative for rash.  Neurological: Negative for  dizziness or headache.  No other specific complaints in a complete review of systems (except as listed in HPI above).  Objective  Vitals:   08/26/17 0943  BP: (!) 162/90  Pulse: 63  Resp: 16  Temp: 98.3 F (36.8 C)  TempSrc: Oral  SpO2: 95%  Weight: 223 lb 9.6 oz (101.4 kg)  Height: 5\' 5"  (1.651 m)    Body mass index is 37.21 kg/m.  Physical Exam  Constitutional: Patient appears well-developed and well-nourished. Obese No distress.  HEENT: head atraumatic, normocephalic, pupils equal and reactive to light, , neck supple, throat  within normal limits Cardiovascular: Normal rate, regular rhythm and normal heart sounds.  No murmur heard. No BLE edema. Pulmonary/Chest: Effort normal and breath sounds normal. No respiratory distress. Abdominal: Soft.  There is no tenderness. Psychiatric: Patient has a normal mood and affect. behavior is normal. Judgment and thought content normal.  PHQ2/9: Depression screen [facility] 2/9 08/26/2017 12/12/2016 10/01/2016 10/19/2015 07/14/2015  Decreased Interest 0 0 0 0 0  Down, Depressed, Hopeless 0 0 0 0 0  PHQ - 2 Score 0 0 0 0 0     Fall Risk: Fall Risk  08/26/2017 12/24/2016 12/12/2016 10/01/2016 10/19/2015  Falls in the past year? No No No Yes Yes  Number falls in past yr: - - - 1 1  Comment - - - tripped over step -  Injury with Fall? - - - No No  Comment - - - - -  Follow up - - - Falls prevention discussed -      Functional Status Survey: Is the patient deaf or have difficulty hearing?: No Does the patient have difficulty seeing, even when wearing glasses/contacts?: Yes Does the patient have difficulty concentrating, remembering, or making decisions?: No Does the patient have difficulty walking or climbing stairs?: No Does the patient have difficulty dressing or bathing?: No Does the patient have difficulty doing errands alone such as visiting a doctor's office or shopping?: No    Assessment & Plan   1. Morbid obesity ([place])  -  Hemoglobin A1c Doing well, seeing Dr. Darnell Level and needs labs checked   2. History of bariatric surgery  We will order labs and fax to bariatric center - CBC with Differential/Platelet - Magnesium - Vitamin B1 - B12 and Folate Panel - Iron, TIBC and Ferritin Panel; Future - Vitamin A - Vitamin E - Vitamin K1, Serum - Zinc - Parathyroid hormone, intact (no Ca) - Copper, serum  3. OSA on CPAP  Continue CPAP   4. Controlled gout   5. Dyslipidemia  - Lipid panel  6. Barrett's esophagus without dysplasia  - CBC with Differential/Platelet - Iron, TIBC and Ferritin Panel; Future  7. Vitamin D deficiency  - VITAMIN D 25 Hydroxy (Vit-D Deficiency, Fractures) - Parathyroid hormone, intact (no Ca)  8. Uncontrolled hypertension  - COMPLETE METABOLIC PANEL WITH GFR - TSH

## 2017-08-26 NOTE — Addendum Note (Signed)
Addended by: Inda Coke on: 08/26/2017 10:33 AM   Modules accepted: Orders

## 2017-08-30 ENCOUNTER — Ambulatory Visit: Payer: Medicare Other | Admitting: Emergency Medicine

## 2017-08-30 VITALS — BP 132/70

## 2017-08-30 DIAGNOSIS — I10 Essential (primary) hypertension: Secondary | ICD-10-CM

## 2017-08-31 ENCOUNTER — Encounter: Payer: Self-pay | Admitting: Family Medicine

## 2017-08-31 DIAGNOSIS — E349 Endocrine disorder, unspecified: Secondary | ICD-10-CM | POA: Insufficient documentation

## 2017-08-31 LAB — PARATHYROID HORMONE, INTACT (NO CA): PTH: 109 pg/mL — AB (ref 14–64)

## 2017-08-31 LAB — HEMOGLOBIN A1C
HEMOGLOBIN A1C: 5.4 %{Hb} (ref <5.7)
MEAN PLASMA GLUCOSE: 108 (calc)
eAG (mmol/L): 6 (calc)

## 2017-08-31 LAB — CBC WITH DIFFERENTIAL/PLATELET
Basophils Absolute: 52 cells/uL (ref 0–200)
Basophils Relative: 0.8 %
EOS PCT: 2.6 %
Eosinophils Absolute: 169 cells/uL (ref 15–500)
HCT: 39 % (ref 35.0–45.0)
Hemoglobin: 12.8 g/dL (ref 11.7–15.5)
Lymphs Abs: 1944 cells/uL (ref 850–3900)
MCH: 30 pg (ref 27.0–33.0)
MCHC: 32.8 g/dL (ref 32.0–36.0)
MCV: 91.3 fL (ref 80.0–100.0)
MONOS PCT: 4.5 %
MPV: 9.6 fL (ref 7.5–12.5)
NEUTROS PCT: 62.2 %
Neutro Abs: 4043 cells/uL (ref 1500–7800)
PLATELETS: 330 10*3/uL (ref 140–400)
RBC: 4.27 10*6/uL (ref 3.80–5.10)
RDW: 13.8 % (ref 11.0–15.0)
Total Lymphocyte: 29.9 %
WBC mixed population: 293 cells/uL (ref 200–950)
WBC: 6.5 10*3/uL (ref 3.8–10.8)

## 2017-08-31 LAB — COMPLETE METABOLIC PANEL WITH GFR
AG Ratio: 1.6 (calc) (ref 1.0–2.5)
ALT: 18 U/L (ref 6–29)
AST: 19 U/L (ref 10–35)
Albumin: 3.9 g/dL (ref 3.6–5.1)
Alkaline phosphatase (APISO): 125 U/L (ref 33–130)
BUN: 11 mg/dL (ref 7–25)
CO2: 29 mmol/L (ref 20–32)
CREATININE: 0.82 mg/dL (ref 0.50–0.99)
Calcium: 8.7 mg/dL (ref 8.6–10.4)
Chloride: 108 mmol/L (ref 98–110)
GFR, Est African American: 85 mL/min/{1.73_m2} (ref >=60)
GFR, Est Non African American: 74 mL/min/{1.73_m2} (ref >=60)
GLOBULIN: 2.4 g/dL (ref 1.9–3.7)
GLUCOSE: 88 mg/dL (ref 65–99)
POTASSIUM: 3.3 mmol/L — AB (ref 3.5–5.3)
SODIUM: 146 mmol/L (ref 135–146)
Total Bilirubin: 0.5 mg/dL (ref 0.2–1.2)
Total Protein: 6.3 g/dL (ref 6.1–8.1)

## 2017-08-31 LAB — LIPID PANEL
Cholesterol: 157 mg/dL (ref <200)
HDL: 58 mg/dL (ref >50)
LDL Cholesterol (Calc): 78 mg/dL (calc)
NON-HDL CHOLESTEROL (CALC): 99 mg/dL (ref <130)
Total CHOL/HDL Ratio: 2.7 (calc) (ref <5.0)
Triglycerides: 126 mg/dL (ref <150)

## 2017-08-31 LAB — MAGNESIUM: MAGNESIUM: 1.8 mg/dL (ref 1.5–2.5)

## 2017-08-31 LAB — COPPER, SERUM: Copper: 109 ug/dL (ref 70–175)

## 2017-08-31 LAB — IRON,TIBC AND FERRITIN PANEL
%SAT: 25 % (ref 16–45)
Ferritin: 74 ng/mL (ref 16–288)
Iron: 67 ug/dL (ref 45–160)
TIBC: 264 ug/dL (ref 250–450)

## 2017-08-31 LAB — ZINC: Zinc: 70 ug/dL (ref 60–130)

## 2017-08-31 LAB — VITAMIN D 25 HYDROXY (VIT D DEFICIENCY, FRACTURES): VIT D 25 HYDROXY: 29 ng/mL — AB (ref 30–100)

## 2017-08-31 LAB — VITAMIN A: Vitamin A (Retinoic Acid): 36 ug/dL — ABNORMAL LOW (ref 38–98)

## 2017-08-31 LAB — VITAMIN E
GAMMA-TOCOPHEROL (VIT E): 1.1 mg/L (ref <=4.3)
Vitamin E (Alpha Tocopherol): 8.3 mg/L (ref 5.7–19.9)

## 2017-08-31 LAB — VITAMIN K1, SERUM: Vitamin K: 99 pg/mL (ref 80–1160)

## 2017-08-31 LAB — VITAMIN B1: VITAMIN B1 (THIAMINE): 24 nmol/L (ref 8–30)

## 2017-08-31 LAB — B12 AND FOLATE PANEL
Folate: 12.2 ng/mL
Vitamin B-12: 899 pg/mL (ref 200–1100)

## 2017-08-31 LAB — TSH: TSH: 2.21 mIU/L (ref 0.40–4.50)

## 2017-09-25 ENCOUNTER — Encounter: Payer: Self-pay | Admitting: Family Medicine

## 2017-09-25 DIAGNOSIS — R790 Abnormal level of blood mineral: Secondary | ICD-10-CM | POA: Insufficient documentation

## 2017-09-25 DIAGNOSIS — R7989 Other specified abnormal findings of blood chemistry: Secondary | ICD-10-CM | POA: Insufficient documentation

## 2017-09-25 DIAGNOSIS — K909 Intestinal malabsorption, unspecified: Secondary | ICD-10-CM | POA: Insufficient documentation

## 2017-09-27 ENCOUNTER — Other Ambulatory Visit: Payer: Self-pay | Admitting: Family Medicine

## 2017-10-02 ENCOUNTER — Other Ambulatory Visit: Payer: Self-pay | Admitting: Gastroenterology

## 2017-10-08 ENCOUNTER — Ambulatory Visit (INDEPENDENT_AMBULATORY_CARE_PROVIDER_SITE_OTHER): Payer: Medicare Other

## 2017-10-08 VITALS — BP 130/84 | HR 52 | Temp 98.1°F | Ht 65.0 in | Wt 224.4 lb

## 2017-10-08 DIAGNOSIS — Z1239 Encounter for other screening for malignant neoplasm of breast: Secondary | ICD-10-CM

## 2017-10-08 DIAGNOSIS — Z1231 Encounter for screening mammogram for malignant neoplasm of breast: Secondary | ICD-10-CM | POA: Diagnosis not present

## 2017-10-08 DIAGNOSIS — Z0000 Encounter for general adult medical examination without abnormal findings: Secondary | ICD-10-CM

## 2017-10-08 DIAGNOSIS — E2839 Other primary ovarian failure: Secondary | ICD-10-CM

## 2017-10-08 DIAGNOSIS — Z23 Encounter for immunization: Secondary | ICD-10-CM

## 2017-10-08 NOTE — Patient Instructions (Addendum)
Shannon Petty , Thank you for taking time to come for your Medicare Wellness Visit. I appreciate your ongoing commitment to your health goals. Please review the following plan we discussed and let me know if I can assist you in the future.   Screening recommendations/referrals: Colorectal Screening: After meeting with Dr. Allen Norris, please call our office to verify when you are actually due for your colonoscopy Mammogram: Please call to schedule your appointment Bone Density: Please call to schedule your appointment  Vision and Dental Exams: Recommended annual ophthalmology exams for early detection of glaucoma and other disorders of the eye Recommended annual dental exams for proper oral hygiene  Vaccinations: Influenza vaccine: Completed today Pneumococcal vaccine: Up to date Tdap vaccine: Up to date Shingles vaccine: Please call your insurance company to determine your out of pocket expense for the Shingrix vaccine. You may receive this vaccine at your local pharmacy.  Advanced directives: Please bring a copy of your POA (Power of Attorney) and/or Living Will to your next appointment.  Goals: Recommend to remove any items from the home that may cause slips or trips.  Next appointment: Please schedule your Annual Wellness Visit with your Nurse Health Advisor in one year.  Preventive Care 23 Years and Older, Female Preventive care refers to lifestyle choices and visits with your health care provider that can promote health and wellness. What does preventive care include?  A yearly physical exam. This is also called an annual well check.  Dental exams once or twice a year.  Routine eye exams. Ask your health care provider how often you should have your eyes checked.  Personal lifestyle choices, including:  Daily care of your teeth and gums.  Regular physical activity.  Eating a healthy diet.  Avoiding tobacco and drug use.  Limiting alcohol use.  Practicing safe sex.  Taking  low-dose aspirin every day.  Taking vitamin and mineral supplements as recommended by your health care provider. What happens during an annual well check? The services and screenings done by your health care provider during your annual well check will depend on your age, overall health, lifestyle risk factors, and family history of disease. Counseling  Your health care provider may ask you questions about your:  Alcohol use.  Tobacco use.  Drug use.  Emotional well-being.  Home and relationship well-being.  Sexual activity.  Eating habits.  History of falls.  Memory and ability to understand (cognition).  Work and work Statistician.  Reproductive health. Screening  You may have the following tests or measurements:  Height, weight, and BMI.  Blood pressure.  Lipid and cholesterol levels. These may be checked every 5 years, or more frequently if you are over 50 years old.  Skin check.  Lung cancer screening. You may have this screening every year starting at age 42 if you have a 30-pack-year history of smoking and currently smoke or have quit within the past 15 years.  Fecal occult blood test (FOBT) of the stool. You may have this test every year starting at age 25.  Flexible sigmoidoscopy or colonoscopy. You may have a sigmoidoscopy every 5 years or a colonoscopy every 10 years starting at age 76.  Hepatitis C blood test.  Hepatitis B blood test.  Sexually transmitted disease (STD) testing.  Diabetes screening. This is done by checking your blood sugar (glucose) after you have not eaten for a while (fasting). You may have this done every 1-3 years.  Bone density scan. This is done to screen for osteoporosis.  You may have this done starting at age 58.  Mammogram. This may be done every 1-2 years. Talk to your health care provider about how often you should have regular mammograms. Talk with your health care provider about your test results, treatment options, and  if necessary, the need for more tests. Vaccines  Your health care provider may recommend certain vaccines, such as:  Influenza vaccine. This is recommended every year.  Tetanus, diphtheria, and acellular pertussis (Tdap, Td) vaccine. You may need a Td booster every 10 years.  Zoster vaccine. You may need this after age 70.  Pneumococcal 13-valent conjugate (PCV13) vaccine. One dose is recommended after age 70.  Pneumococcal polysaccharide (PPSV23) vaccine. One dose is recommended after age 16. Talk to your health care provider about which screenings and vaccines you need and how often you need them. This information is not intended to replace advice given to you by your health care provider. Make sure you discuss any questions you have with your health care provider. Document Released: 02/04/2015 Document Revised: 09/28/2015 Document Reviewed: 11/09/2014 Elsevier Interactive Patient Education  2017 [place] Prevention in the Home Falls can cause injuries. They can happen to people of all ages. There are many things you can do to make your home safe and to help prevent falls. What can I do on the outside of my home?  Regularly fix the edges of walkways and driveways and fix any cracks.  Remove anything that might make you trip as you walk through a door, such as a raised step or threshold.  Trim any bushes or trees on the path to your home.  Use bright outdoor lighting.  Clear any walking paths of anything that might make someone trip, such as rocks or tools.  Regularly check to see if handrails are loose or broken. Make sure that both sides of any steps have handrails.  Any raised decks and porches should have guardrails on the edges.  Have any leaves, snow, or ice cleared regularly.  Use sand or salt on walking paths during winter.  Clean up any spills in your garage right away. This includes oil or grease spills. What can I do in the bathroom?  Use night  lights.  Install grab bars by the toilet and in the tub and shower. Do not use towel bars as grab bars.  Use non-skid mats or decals in the tub or shower.  If you need to sit down in the shower, use a plastic, non-slip stool.  Keep the floor dry. Clean up any water that spills on the floor as soon as it happens.  Remove soap buildup in the tub or shower regularly.  Attach bath mats securely with double-sided non-slip rug tape.  Do not have throw rugs and other things on the floor that can make you trip. What can I do in the bedroom?  Use night lights.  Make sure that you have a light by your bed that is easy to reach.  Do not use any sheets or blankets that are too big for your bed. They should not hang down onto the floor.  Have a firm chair that has side arms. You can use this for support while you get dressed.  Do not have throw rugs and other things on the floor that can make you trip. What can I do in the kitchen?  Clean up any spills right away.  Avoid walking on wet floors.  Keep items that you use a  lot in easy-to-reach places.  If you need to reach something above you, use a strong step stool that has a grab bar.  Keep electrical cords out of the way.  Do not use floor polish or wax that makes floors slippery. If you must use wax, use non-skid floor wax.  Do not have throw rugs and other things on the floor that can make you trip. What can I do with my stairs?  Do not leave any items on the stairs.  Make sure that there are handrails on both sides of the stairs and use them. Fix handrails that are broken or loose. Make sure that handrails are as long as the stairways.  Check any carpeting to make sure that it is firmly attached to the stairs. Fix any carpet that is loose or worn.  Avoid having throw rugs at the top or bottom of the stairs. If you do have throw rugs, attach them to the floor with carpet tape.  Make sure that you have a light switch at the  top of the stairs and the bottom of the stairs. If you do not have them, ask someone to add them for you. What else can I do to help prevent falls?  Wear shoes that:  Do not have high heels.  Have rubber bottoms.  Are comfortable and fit you well.  Are closed at the toe. Do not wear sandals.  If you use a stepladder:  Make sure that it is fully opened. Do not climb a closed stepladder.  Make sure that both sides of the stepladder are locked into place.  Ask someone to hold it for you, if possible.  Clearly mark and make sure that you can see:  Any grab bars or handrails.  First and last steps.  Where the edge of each step is.  Use tools that help you move around (mobility aids) if they are needed. These include:  Canes.  Walkers.  Scooters.  Crutches.  Turn on the lights when you go into a dark area. Replace any light bulbs as soon as they burn out.  Set up your furniture so you have a clear path. Avoid moving your furniture around.  If any of your floors are uneven, fix them.  If there are any pets around you, be aware of where they are.  Review your medicines with your doctor. Some medicines can make you feel dizzy. This can increase your chance of falling. Ask your doctor what other things that you can do to help prevent falls. This information is not intended to replace advice given to you by your health care provider. Make sure you discuss any questions you have with your health care provider. Document Released: 11/04/2008 Document Revised: 06/16/2015 Document Reviewed: 02/12/2014 Elsevier Interactive Patient Education  2017 [employer].

## 2017-10-08 NOTE — Progress Notes (Signed)
Subjective:   Shannon Petty is a 67 y.o. female who presents for an Initial Medicare Annual Wellness Visit.  Review of Systems    N/A  Cardiac Risk Factors include: advanced age (>40men, >63 women);dyslipidemia;hypertension;obesity (BMI >30kg/m2);sedentary lifestyle     Objective:    Today's Vitals   10/08/17 1039  BP: 130/84  Pulse: (!) 52  Temp: 98.1 F (36.7 C)  TempSrc: Oral  SpO2: 91%  Weight: 224 lb 6.4 oz (101.8 kg)  Height: 5\' 5"  (1.651 m)   Body mass index is 37.34 kg/m.  Advanced Directives 10/08/2017 10/01/2016 05/29/2016 10/19/2015 07/14/2015 04/08/2015 12/20/2014  Does Patient Have a Medical Advance Directive? Yes Yes Yes Yes Yes Yes No  Type of Paramedic of [place];Living will [place];Living will [place];Living will [place];Living will [place];Living will [place];Living will -  Does patient want to make changes to medical advance directive? - - - No - Patient declined No - Patient declined - -  Copy of [place] in Chart? No - copy requested No - copy requested - No - copy requested No - copy requested Yes -    Current Medications (verified) Outpatient Encounter Medications as of 10/08/2017  Medication Sig  . amLODipine (NORVASC) 5 MG tablet Take 1 tablet (5 mg total) by mouth daily.  Marland Kitchen aspirin EC 81 MG tablet Take 81 mg by mouth daily.  Marland Kitchen atorvastatin (LIPITOR) 20 MG tablet Take 1 tablet (20 mg total) by mouth daily.  . Calcium Carb-Cholecalciferol (CALCIUM-VITAMIN D) 500-200 MG-UNIT tablet Take by mouth.  . cholecalciferol (VITAMIN D) 1000 UNITS tablet Take 1 tablet (1,000 Units total) by mouth daily.  . febuxostat (ULORIC) 40 MG tablet Take 1 tablet (40 mg total) by mouth daily.  . Multiple Vitamin (MULTI-VITAMINS) TABS Take by mouth.  Marland Kitchen NEXIUM 40 MG capsule TAKE 1 CAPSULE BY MOUTH TWO TIMES DAILY-BRAND ONLY  .  spironolactone (ALDACTONE) 50 MG tablet Take 1 tablet (50 mg total) by mouth daily.   No facility-administered encounter medications on file as of 10/08/2017.     Allergies (verified) Pistachio nut extract skin test and Allopurinol   History: Past Medical History:  Diagnosis Date  . Benign paroxysmal positional vertigo    unspecified laterality  . Controlled gout   . Eczema   . Gallstones 01/11/2017  . History of laparoscopic adjustable gastric banding   . Hyperlipidemia   . Hypertension   . Obesity   . OSA (obstructive sleep apnea)   . Venous insufficiency of both lower extremities   . Vitamin D deficiency    Past Surgical History:  Procedure Laterality Date  . BUNIONECTOMY  2003  . CHOLECYSTECTOMY, LAPAROSCOPIC  01/31/2017  . LAPAROSCOPIC GASTRIC BAND REMOVAL WITH LAPAROSCOPIC GASTRIC SLEEVE RESECTION  04/23/2011  . LAPAROSCOPIC GASTRIC BANDING  2008  . LAPAROSCOPIC GASTRIC RESTRICTIVE DUODENAL PROCEDURE (DUODENAL SWITCH)  01/31/2017   Family History  Problem Relation Age of Onset  . Hypertension Mother   . Diabetes Mother   . Diabetes Brother   . Multiple sclerosis Brother    Social History   Socioeconomic History  . Marital status: Married    Spouse name: Shannon Petty  . Number of children: 2  . Years of education: Not on file  . Highest education level: Bachelor's degree (e.g., BA, AB, BS)  Occupational History  . Occupation: Retired  Scientific laboratory technician  . Financial resource strain: Not hard at all  .  Food insecurity:    Worry: Never true    Inability: Never true  . Transportation needs:    Medical: No    Non-medical: No  Tobacco Use  . Smoking status: Former Smoker    Packs/day: 0.50    Years: 8.00    Pack years: 4.00    Types: Cigarettes    Start date: 01/23/1976    Last attempt to quit: 10/17/1984    Years since quitting: 32.9  . Smokeless tobacco: Never Used  . Tobacco comment: smoking cessation materials not required  Substance and Sexual Activity  .  Alcohol use: Yes    Alcohol/week: 0.0 standard drinks    Comment: socially  . Drug use: No  . Sexual activity: Yes    Partners: Male    Birth control/protection: Post-menopausal  Lifestyle  . Physical activity:    Days per week: 0 days    Minutes per session: 0 min  . Stress: Not at all  Relationships  . Social connections:    Talks on phone: Patient refused    Gets together: Patient refused    Attends religious service: Patient refused    Active member of club or organization: Patient refused    Attends meetings of clubs or organizations: Patient refused    Relationship status: Married  Other Topics Concern  . Not on file  Social History Narrative  . Not on file    Tobacco Counseling Counseling given: No Comment: smoking cessation materials not required  Clinical Intake:  Pre-visit preparation completed: Yes  Pain : No/denies pain   BMI - recorded: 37.34 Nutritional Status: BMI > 30  Obese Nutritional Risks: None Diabetes: No  How often do you need to have someone help you when you read instructions, pamphlets, or other written materials from your doctor or pharmacy?: 1 - Never  Interpreter Needed?: No  Information entered by :: AE[place]sole, LPN   Activities of Daily Living In your present state of health, do you have any difficulty performing the following activities: 10/08/2017 08/26/2017  Hearing? N N  Comment denies hearing aids -  Vision? N Y  Comment wears eyeglasses -  Difficulty concentrating or making decisions? N N  Walking or climbing stairs? N N  Dressing or bathing? N N  Doing errands, shopping? N N  Preparing Food and eating ? N -  Comment denies dentures -  Using the Toilet? N -  In the past six months, have you accidently leaked urine? Y -  Comment stress incontinence -  Do you have problems with loss of bowel control? N -  Managing your Medications? N -  Managing your Finances? N -  Housekeeping or managing your Housekeeping? N -  Some  recent data might be hidden     Immunizations and Health Maintenance Immunization History  Administered Date(s) Administered  . Influenza, High Dose Seasonal PF 10/18/2014, 10/01/2016, 10/08/2017  . Influenza,inj,Quad PF,6+ Mos 10/19/2015  . Influenza-Unspecified 09/08/2013  . Pneumococcal Conjugate-13 10/19/2015  . Pneumococcal Polysaccharide-23 10/18/2014  . Tdap 10/30/2006, 12/12/2016  . Zoster 01/22/2010   Health Maintenance Due  Topic Date Due  . MAMMOGRAM  11/08/2016    Patient Care Team: Steele Sizer, MD as PCP - General (Family Medicine) Bonner Puna, MD as Consulting Physician (Specialist) [place], [place] as Consulting Physician (Optometry) Lucilla Lame, MD as Consulting Physician (Gastroenterology)  Indicate any recent Medical Services you may have received from other than [facility] providers in the past year (date may be approximate).  Assessment:   This is a routine wellness examination for [place].  Hearing/Vision screen Vision Screening Comments: Sees Shannon Petty for annual eye exams  Dietary issues and exercise activities discussed: Current Exercise Habits: The patient does not participate in regular exercise at present, Exercise limited by: None identified  Goals    . Increase water intake     Recommend increasing water intake to 4-6 glasses a day.     . Prevent falls     Recommend to remove any items from the home that may cause slips or trips.      Depression Screen PHQ 2/9 Scores 10/08/2017 08/26/2017 12/12/2016 10/01/2016 10/19/2015 07/14/2015 04/08/2015  PHQ - 2 Score 0 0 0 0 0 0 0  PHQ- 9 Score 0 - - - - - -    Fall Risk Fall Risk  10/08/2017 08/26/2017 12/24/2016 12/12/2016 10/01/2016  Falls in the past year? No No No No Yes  Number falls in past yr: - - - - 1  Comment - - - - tripped over step  Injury with Fall? - - - - No  Comment - - - - -  Risk for fall due to : Impaired vision;History of fall(s) - - - -  Risk for fall due to:  Comment wears eyeglasses; tripped at church - - - -  Follow up - - - - Falls prevention discussed    [place]: Is your home free of loose throw rugs in walkways, pet beds, electrical cords, etc? Yes Is there adequate lighting in your home to reduce risk of falls?  Yes Are there stairs in or around your home WITH handrails? Yes  ASSISTIVE DEVICES UTILIZED TO PREVENT FALLS: Use of a cane, walker or w/c? No Grab bars in the bathroom? No  Shower chair or a place to sit while bathing? Yes An elevated toilet seat or a handicapped toilet? Yes  Timed Get Up and Go Performed: Yes. Pt ambulated 10 feet within 8 sec. Gait stead-fast and without the use of an assistive device. No intervention required at this time. Fall risk prevention has been discussed.  Community Resource Referral:  Pt declined my offer to send Shannon Petty Referral to Care Guide for installation of grab bars in the shower.  Cognitive Function:     6CIT Screen 10/08/2017 10/01/2016  What Year? 0 points 0 points  What month? 0 points 0 points  What time? 0 points 0 points  Count back from 20 0 points 0 points  Months in reverse 0 points 0 points  Repeat phrase 0 points 0 points  Total Score 0 0    Screening Tests Health Maintenance  Topic Date Due  . MAMMOGRAM  11/08/2016  . COLONOSCOPY  10/15/2017 (Originally 02/25/2017)  . TETANUS/TDAP  12/13/2026  . INFLUENZA VACCINE  Completed  . DEXA SCAN  Completed  . Hepatitis C Screening  Completed  . PNA vac Low Risk Adult  Completed    Qualifies for Shingles Vaccine? Yes. Zostavax completed 01/22/10. Due for Shingrix. Education has been provided regarding the importance of this vaccine. Pt has been advised to call insurance company to determine out of pocket expense. Advised may also receive vaccine at local pharmacy or Health Dept. Verbalized acceptance and understanding.  Cancer Screenings: Lung: Low Dose CT Chest recommended if Age  10-80 years, 30 pack-year currently smoking OR have quit w/in 15years. Patient does not qualify. Breast: Up to date on Mammogram? No. Completed 11/09/15. Ordered 12/12/16 but  not yet completed. New order placed today. Provided pt with contact info and advised to schedule appt.   Up to date of Bone Density/Dexa? No. Completed 12/30/14. Results reflect normal. Repeat every 2 years. Ordered today. Provided pt with contact info and advised to schedule appt.   Colorectal: Completed 02/25/14. Repeat every 3 years. States she is scheduled to be seen by Dr. Allen Norris next week for discussion about an updated EGD for Barrett's Esophagus. States she does not believe she is due for an updated colonoscopy. Unable to locate report to verify frequency. Advised to call our office to provide [place] with Dr. Dorothey Baseman recommendation re: repeat colonoscopy. Verbalized acceptance and understanding.  Additional Screenings: Hepatitis C Screening: Completed 08/13/13   Plan:  I have personally reviewed and addressed the Medicare Annual Wellness questionnaire and have noted the following in the patient's chart:  A. Medical and social history B. Use of alcohol, tobacco or illicit drugs  C. Current medications and supplements D. Functional ability and status E.  Nutritional status F.  Physical activity G. Advance directives H. List of other physicians I.  Hospitalizations, surgeries, and ER visits in previous 12 months J.  [place] such as hearing and vision if needed, cognitive and depression L. Referrals and appointments  In addition, I have reviewed and discussed with patient certain preventive protocols, quality metrics, and best practice recommendations. A written personalized care plan for preventive services as well as general preventive health recommendations were provided to patient.  See attached scanned questionnaire for additional information.   Signed,  Aleatha Borer, LPN Nurse Health Advisor

## 2017-10-15 ENCOUNTER — Ambulatory Visit (INDEPENDENT_AMBULATORY_CARE_PROVIDER_SITE_OTHER): Payer: Medicare Other | Admitting: Gastroenterology

## 2017-10-15 ENCOUNTER — Encounter: Payer: Self-pay | Admitting: Gastroenterology

## 2017-10-15 VITALS — BP 158/90 | HR 59 | Ht 65.0 in | Wt 224.8 lb

## 2017-10-15 DIAGNOSIS — K219 Gastro-esophageal reflux disease without esophagitis: Secondary | ICD-10-CM | POA: Diagnosis not present

## 2017-10-15 NOTE — Progress Notes (Signed)
Primary Care Physician: Steele Sizer, MD  Primary Gastroenterologist:  Dr. Lucilla Lame  Chief Complaint  Patient presents with  . Medication Refill    HPI: Shannon Petty is a 67 y.o. female here for follow-up of her heartburn.  The patient states that she tried to stop her PPI with resulting heartburn.  She also reports that she is lost 40 pounds due to her bariatric surgery.  The patient is not have any black stools or bloody stools.  She also denies any abdominal pain nausea or vomiting.  Current Outpatient Medications  Medication Sig Dispense Refill  . amLODipine (NORVASC) 5 MG tablet Take 1 tablet (5 mg total) by mouth daily. 90 tablet 0  . aspirin EC 81 MG tablet Take 81 mg by mouth daily.    Marland Kitchen atorvastatin (LIPITOR) 20 MG tablet Take 1 tablet (20 mg total) by mouth daily. 90 tablet 1  . Calcium Carb-Cholecalciferol (CALCIUM-VITAMIN D) 500-200 MG-UNIT tablet Take by mouth.    . cholecalciferol (VITAMIN D) 1000 UNITS tablet Take 1 tablet (1,000 Units total) by mouth daily. 30 tablet 0  . febuxostat (ULORIC) 40 MG tablet Take 1 tablet (40 mg total) by mouth daily. 90 tablet 1  . Multiple Vitamin (MULTI-VITAMINS) TABS Take by mouth.    Marland Kitchen NEXIUM 40 MG capsule TAKE 1 CAPSULE BY MOUTH TWO TIMES DAILY 180 capsule 0  . spironolactone (ALDACTONE) 50 MG tablet Take 1 tablet (50 mg total) by mouth daily. 90 tablet 1   No current facility-administered medications for this visit.     Allergies as of 10/15/2017 - Review Complete 10/15/2017  Allergen Reaction Noted  . Pistachio nut extract skin test  04/11/2017  . Allopurinol Other (See Comments) 10/15/2014    ROS:  General: Negative for anorexia, weight loss, fever, chills, fatigue, weakness. ENT: Negative for hoarseness, difficulty swallowing , nasal congestion. CV: Negative for chest pain, angina, palpitations, dyspnea on exertion, peripheral edema.  Respiratory: Negative for dyspnea at rest, dyspnea on exertion, cough,  sputum, wheezing.  GI: See history of present illness. GU:  Negative for dysuria, hematuria, urinary incontinence, urinary frequency, nocturnal urination.  Endo: Negative for unusual weight change.    Physical Examination:   BP (!) 158/90   Pulse (!) 59   Ht 5\' 5"  (1.651 m)   Wt 224 lb 12.8 oz (102 kg)   BMI 37.41 kg/m   General: Well-nourished, well-developed in no acute distress.  Eyes: No icterus. Conjunctivae pink. Mouth: Oropharyngeal mucosa moist and pink , no lesions erythema or exudate. Lungs: Clear to auscultation bilaterally. Non-labored. Heart: Regular rate and rhythm, no murmurs rubs or gallops.  Abdomen: Bowel sounds are normal, nontender, nondistended, no hepatosplenomegaly or masses, no abdominal bruits or hernia , no rebound or guarding.   Extremities: No lower extremity edema. No clubbing or deformities. Neuro: Alert and oriented x 3.  Grossly intact. Skin: Warm and dry, no jaundice.   Psych: Alert and cooperative, normal mood and affect.  Labs:    Imaging Studies: No results found.  Assessment and Plan:   Shannon Petty is a 67 y.o. y/o female who comes in today with a history of reflux and a refill of her prescriptions.  The patient has not had any problems on her PPI and denies any worry symptoms such as black stools or bloody stools.  She also denies any nausea vomiting fevers chills or dysphasia.  The patient will have her medications refilled and follow-up as needed.    Lucilla Lame,  MD. [name]   Note: This dictation was prepared with Dragon dictation along with smaller phrase technology. Any transcriptional errors that result from this process are unintentional.

## 2017-10-21 ENCOUNTER — Other Ambulatory Visit: Payer: Self-pay | Admitting: Family Medicine

## 2017-10-21 DIAGNOSIS — I10 Essential (primary) hypertension: Secondary | ICD-10-CM

## 2017-11-26 ENCOUNTER — Ambulatory Visit: Payer: Medicare Other | Admitting: Family Medicine

## 2017-11-26 ENCOUNTER — Encounter: Payer: Self-pay | Admitting: Family Medicine

## 2017-11-26 VITALS — BP 126/84 | HR 64 | Temp 98.4°F | Resp 16 | Ht 65.0 in | Wt 226.4 lb

## 2017-11-26 DIAGNOSIS — I10 Essential (primary) hypertension: Secondary | ICD-10-CM

## 2017-11-26 DIAGNOSIS — Z9989 Dependence on other enabling machines and devices: Secondary | ICD-10-CM

## 2017-11-26 DIAGNOSIS — Z9884 Bariatric surgery status: Secondary | ICD-10-CM | POA: Diagnosis not present

## 2017-11-26 DIAGNOSIS — E785 Hyperlipidemia, unspecified: Secondary | ICD-10-CM

## 2017-11-26 DIAGNOSIS — K2270 Barrett's esophagus without dysplasia: Secondary | ICD-10-CM

## 2017-11-26 DIAGNOSIS — G4733 Obstructive sleep apnea (adult) (pediatric): Secondary | ICD-10-CM | POA: Diagnosis not present

## 2017-11-26 DIAGNOSIS — M109 Gout, unspecified: Secondary | ICD-10-CM

## 2017-11-26 MED ORDER — ATORVASTATIN CALCIUM 20 MG PO TABS: 20.0000 mg | ORAL_TABLET | Freq: Every day | ORAL | 1 refills | Status: DC

## 2017-11-26 MED ORDER — AMLODIPINE BESYLATE 5 MG PO TABS: 5.0000 mg | ORAL_TABLET | Freq: Every day | ORAL | 1 refills | Status: DC

## 2017-11-26 MED ORDER — FEBUXOSTAT 40 MG PO TABS: 40.0000 mg | ORAL_TABLET | Freq: Every day | ORAL | 1 refills | Status: DC

## 2017-11-26 MED ORDER — SPIRONOLACTONE 50 MG PO TABS: 50.0000 mg | ORAL_TABLET | Freq: Every day | ORAL | 1 refills | Status: DC

## 2017-11-26 NOTE — Progress Notes (Signed)
Name: Shannon Petty   MRN: [id number]    DOB: 26-Apr-1950   Date:11/26/2017       Progress Note  Subjective  Chief Complaint  Chief Complaint  Patient presents with  . Medication Refill  . Hypertension    Denies any symptoms  . Hyperlipidemia  . Gastroesophageal Reflux    Worst when she eats late at night  . Sleep Apnea  . Gout    Has not had a recent flair up    HPI  HTN:she states she had not been compliant with medication lately. BP was very high during visit with bariatric center 180/90 in July, it was also elevated in our office on her last visit. 162/90. She is taking medication daily now and bp is at goal. No chest pain or palpitation.   Hyperlipidemia: shetaking Atorvastatin a few times a week, last LDL is at goal, she stopped aspirin, but ASCVD is very high and advised to resume aspirin 81 mg chewable and get bp under control . No chest pain or palpitation   Gout: she states she has not have a gout attacks in years, taking Uloric, she has not been compliant with medication. Unchanged   Obesity: she had gastric band in 2008 and sleeve in 2013 by Dr. Darnell Level. Maximum weight 275 lbs,she had another procedure, duodenal switch on 01/31/2017 her weight was 259 lbs that day and today is was  down to 223.6 lbs but gained some weight since last visit, she states she went on vacation.  No problems since surgery, feeling well.  GERD/Barrett's esophagus: she ran out of Nexium in July and had severe pain and regurgitation. Seen by Dr. Durwin Reges and was given a refill of PPI, she states she usually takes once a day, but occasionally twice a day. Symptoms resolved with medication  OSA/CPAP: she is very compliant with CPAP machine. She wakes up without a headaches, no snoring and denies daytime fatigue.She as been losing weight since 01/2017 after gastric switch surgery and explained that pressure may change. Unchanged    Patient Active Problem List   Diagnosis Date Noted  .  Malabsorption 09/25/2017  . Low magnesium level 09/25/2017  . Low serum vitamin A 09/25/2017  . Elevated parathyroid hormone 08/31/2017  . Barrett esophagus 10/18/2014  . Morbid obesity ([place]) 10/18/2014  . Chronic eczema 10/18/2014  . GERD (gastroesophageal reflux disease) 10/18/2014  . Hypertension, benign 10/18/2014  . Vitamin D deficiency 10/18/2014  . Controlled gout 10/18/2014  . Hyperlipidemia 10/18/2014  . Venous insufficiency 10/18/2014  . OSA on CPAP 10/18/2014  . 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 History of bariatric surgery 04/23/2011    Past Surgical History:  Procedure Laterality Date  . BUNIONECTOMY  2003  . CHOLECYSTECTOMY, LAPAROSCOPIC  01/31/2017  . LAPAROSCOPIC GASTRIC BAND REMOVAL WITH LAPAROSCOPIC GASTRIC SLEEVE RESECTION  04/23/2011  . LAPAROSCOPIC GASTRIC BANDING  2008  . LAPAROSCOPIC GASTRIC RESTRICTIVE DUODENAL PROCEDURE (DUODENAL SWITCH)  01/31/2017    Family History  Problem Relation Age of Onset  . Hypertension Mother   . Diabetes Mother   . Diabetes Brother   . Multiple sclerosis Brother     Social History   Socioeconomic History  . Marital status: Married    Spouse name: [name]  . Number of children: 2  . Years of education: Not on file  . Highest education level: Bachelor's degree (e.g., BA, AB, BS)  Occupational History  . 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 Food insecurity:    Worry: Never true    Inability: Never true  . Transportation needs:    Medical: No    Non-medical: No  Tobacco Use  . Smoking status: Former Smoker    Packs/day: 0.50    Years: 8.00    Pack years: 4.00    Types: Cigarettes    Start date: 01/23/1976    Last attempt to quit: 10/17/1984    Years since quitting: 33.1  . Smokeless tobacco: Never Used  . Tobacco comment: smoking cessation materials not required  Substance and Sexual Activity  . Alcohol use: Yes     Alcohol/week: 0.0 standard drinks    Comment: socially  . Drug use: No  . Sexual activity: Yes    Partners: Male    Birth control/protection: Post-menopausal  Lifestyle  . Physical activity:    Days per week: 0 days    Minutes per session: 0 min  . Stress: Not at all  Relationships  . Social connections:    Talks on phone: Patient refused    Gets together: Patient refused    Attends religious service: Patient refused    Active member of club or organization: Patient refused    Attends meetings of clubs or organizations: Patient refused    Relationship status: Married  . Intimate partner violence:    Fear of current or ex partner: No    Emotionally abused: No    Physically abused: No    Forced sexual activity: No  Other Topics Concern  . Not on file  Social History Narrative  . Not on file     Current Outpatient Medications:  .  amLODipine (NORVASC) 5 MG tablet, TAKE 1 TABLET BY MOUTH EVERY DAY, Disp: 90 tablet, Rfl: 0 .  aspirin EC 81 MG tablet, Take 81 mg by mouth daily., Disp: , Rfl:  .  atorvastatin (LIPITOR) 20 MG tablet, Take 1 tablet (20 mg total) by mouth daily., Disp: 90 tablet, Rfl: 1 .  Calcium Carb-Cholecalciferol (CALCIUM-VITAMIN D) 500-200 MG-UNIT tablet, Take by mouth., Disp: , Rfl:  .  cholecalciferol (VITAMIN D) 1000 UNITS tablet, Take 1 tablet (1,000 Units total) by mouth daily., Disp: 30 tablet, Rfl: 0 .  febuxostat (ULORIC) 40 MG tablet, Take 1 tablet (40 mg total) by mouth daily., Disp: 90 tablet, Rfl: 1 .  Multiple Vitamin (MULTI-VITAMINS) TABS, Take by mouth., Disp: , Rfl:  .  NEXIUM 40 MG capsule, TAKE 1 CAPSULE BY MOUTH TWO TIMES DAILY, Disp: 180 capsule, Rfl: 0 .  spironolactone (ALDACTONE) 50 MG tablet, Take 1 tablet (50 mg total) by mouth daily., Disp: 90 tablet, Rfl: 1  Allergies  Allergen Reactions  . Pistachio Nut Extract Skin Test   . Allopurinol Other (See Comments)    rash    I personally reviewed active problem list, medication list,  allergies, family history, social history with the patient/caregiver today.   ROS  Constitutional: Negative for fever or weight change.  Respiratory: Negative for cough and shortness of breath.   Cardiovascular: Negative for chest pain or palpitations.  Gastrointestinal: Negative for abdominal pain, no bowel changes.  Musculoskeletal: Negative for gait problem or joint swelling.  Skin: Negative for rash.  Neurological: Negative for dizziness or headache.  No other specific complaints in a complete review of systems (except as listed in HPI above).  Objective  Vitals:   11/26/17 0848  BP: 126/84  Pulse: 64  Resp: 16  Temp: 98.4 F (  36.9 C)  TempSrc: Oral  SpO2: 99%  Weight: 226 lb 6.4 oz (102.7 kg)  Height: 5\' 5"  (1.651 m)    Body mass index is 37.67 kg/m.  Physical Exam  Constitutional: Patient appears well-developed and well-nourished. Obese  No distress.  HEENT: head atraumatic, normocephalic, pupils equal and reactive to light,  neck supple, throat within normal limits Cardiovascular: Normal rate, regular rhythm and normal heart sounds.  No murmur heard. No BLE edema. Pulmonary/Chest: Effort normal and breath sounds normal. No respiratory distress. Abdominal: Soft.  There is no tenderness. Psychiatric: Patient has a normal mood and affect. behavior is normal. Judgment and thought content normal.  PHQ2/9: Depression screen [facility] 2/9 11/26/2017 10/08/2017 08/26/2017 12/12/2016 10/01/2016  Decreased Interest 0 0 0 0 0  Down, Depressed, Hopeless 0 0 0 0 0  PHQ - 2 Score 0 0 0 0 0  Altered sleeping 0 0 - - -  Tired, decreased energy 0 0 - - -  Change in appetite 0 0 - - -  Feeling bad or failure about yourself  0 0 - - -  Trouble concentrating 0 0 - - -  Moving slowly or fidgety/restless 0 0 - - -  Suicidal thoughts 0 0 - - -  PHQ-9 Score 0 0 - - -  Difficult doing work/chores Not difficult at all Not difficult at all - - -     Fall Risk: Fall Risk  11/26/2017  10/08/2017 08/26/2017 12/24/2016 12/12/2016  Falls in the past year? 0 No No No No  Number falls in past yr: 0 - - - -  Comment - - - - -  Injury with Fall? 0 - - - -  Comment - - - - -  Risk for fall due to : - Impaired vision;History of fall(s) - - -  Risk for fall due to: Comment - wears eyeglasses; tripped at church - - -  Follow up - - - - -      Assessment & Plan  1. Hypertension, benign  - amLODipine (NORVASC) 5 MG tablet; Take 1 tablet (5 mg total) by mouth daily.  Dispense: 90 tablet; Refill: 1  2. Morbid obesity ([place])  S/p bariatric surgery, increase protein intake  3. History of bariatric surgery   4. OSA on CPAP   5. Controlled gout  - febuxostat (ULORIC) 40 MG tablet; Take 1 tablet (40 mg total) by mouth daily.  Dispense: 90 tablet; Refill: 1  6. Dyslipidemia  - atorvastatin (LIPITOR) 20 MG tablet; Take 1 tablet (20 mg total) by mouth daily.  Dispense: 90 tablet; Refill: 1  7. Barrett's esophagus without dysplasia  Seen by Dr. Durwin Reges, we will contact his office since EGD and colonoscopy not scheduled

## 2018-01-01 ENCOUNTER — Other Ambulatory Visit: Payer: Self-pay | Admitting: Gastroenterology

## 2018-03-27 ENCOUNTER — Ambulatory Visit: Payer: Medicare Other | Admitting: Family Medicine

## 2018-03-27 ENCOUNTER — Other Ambulatory Visit: Payer: Self-pay

## 2018-03-27 ENCOUNTER — Encounter: Payer: Self-pay | Admitting: Family Medicine

## 2018-03-27 ENCOUNTER — Telehealth: Payer: Self-pay

## 2018-03-27 DIAGNOSIS — M109 Gout, unspecified: Secondary | ICD-10-CM | POA: Diagnosis not present

## 2018-03-27 DIAGNOSIS — E559 Vitamin D deficiency, unspecified: Secondary | ICD-10-CM

## 2018-03-27 DIAGNOSIS — Z1211 Encounter for screening for malignant neoplasm of colon: Secondary | ICD-10-CM

## 2018-03-27 DIAGNOSIS — I10 Essential (primary) hypertension: Secondary | ICD-10-CM | POA: Diagnosis not present

## 2018-03-27 DIAGNOSIS — Z9884 Bariatric surgery status: Secondary | ICD-10-CM

## 2018-03-27 DIAGNOSIS — G4733 Obstructive sleep apnea (adult) (pediatric): Secondary | ICD-10-CM

## 2018-03-27 DIAGNOSIS — E785 Hyperlipidemia, unspecified: Secondary | ICD-10-CM | POA: Diagnosis not present

## 2018-03-27 DIAGNOSIS — Z9989 Dependence on other enabling machines and devices: Secondary | ICD-10-CM

## 2018-03-27 MED ORDER — SPIRONOLACTONE 50 MG PO TABS: 50.0000 mg | ORAL_TABLET | Freq: Every day | ORAL | 1 refills | Status: DC

## 2018-03-27 MED ORDER — AMLODIPINE BESYLATE 5 MG PO TABS: 5.0000 mg | ORAL_TABLET | Freq: Every day | ORAL | 1 refills | Status: AC

## 2018-03-27 MED ORDER — ATORVASTATIN CALCIUM 20 MG PO TABS: 20.0000 mg | ORAL_TABLET | Freq: Every day | ORAL | 1 refills | Status: DC

## 2018-03-27 MED ORDER — FEBUXOSTAT 40 MG PO TABS: 40.0000 mg | ORAL_TABLET | Freq: Every day | ORAL | 1 refills | Status: DC

## 2018-03-27 NOTE — Progress Notes (Signed)
Name: Shannon Petty   MRN: [id number]    DOB: November 16, 1950   Date:03/27/2018       Progress Note  Subjective  Chief Complaint  Chief Complaint  Patient presents with  . Medication Refill  . Hypertension    Denies any symptoms  . Hyperlipidemia  . Gastroesophageal Reflux  . Sleep Apnea    HPI  HTN:she states she had not been compliant with medication lately. BP was very high during visit with bariatric center 180/90 in July, back in Nov in our office was 162/90. She is taking medication daily now, but did not this am yet. She has not been checking bp at home. BP today is 142/82 and since she did not take medication we will continue current dose   Hyperlipidemia: shetaking Atorvastatin a few times a week,last LDL is at goal, she stopped aspirin, but ASCVD is very high and advised to resume aspirin 81 mg chewable . She is still taking aspirin 325 mg that she had at home, discussed risk of GI bleeding.  No chest pain or palpitation   Gout: she states she has not have a gout attacks in years, taking Uloric, she has not been compliant with medication. Unchanged   Obesity: she had gastric band in 2008 and sleeve in 2013 by Dr. Darnell Level. Maximum weight 275 lbs before 2nd surgery ,she had another procedure, duodenal switch on 01/31/2017 her weight was 259 lbs that day and today is was  down to 223. Lbs and today is up to 229 lbs. Discussed life style modification .  GERD/Barrett's esophagus:she ran out of Nexium in July and had severe pain and regurgitation. Seen by Dr. Durwin Reges and was given a refill of PPI, she states she usually takes once a day, but occasionally twice a day. Based on her records she is due for repeat colonoscopy , I will message him today   OSA/CPAP: she is very compliant with CPAP machine. She wakes up without a headaches, no snoring and denies daytime fatigue.She as been losing weight since 01/2017 after gastric switch surgery and explained that pressuremay change. Since  weight has been stable we will continue same pressure for now   Patient Active Problem List   Diagnosis Date Noted  . Malabsorption 09/25/2017  . Low magnesium level 09/25/2017  . Low serum vitamin A 09/25/2017  . Elevated parathyroid hormone 08/31/2017  . Barrett esophagus 10/18/2014  . Morbid obesity ([place]) 10/18/2014  . Chronic eczema 10/18/2014  . GERD (gastroesophageal reflux disease) 10/18/2014  . Hypertension, benign 10/18/2014  . Vitamin D deficiency 10/18/2014  . Controlled gout 10/18/2014  . Hyperlipidemia 10/18/2014  . Venous insufficiency 10/18/2014  . OSA on CPAP 10/18/2014  . 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 History of bariatric surgery 04/23/2011    Past Surgical History:  Procedure Laterality Date  . BUNIONECTOMY  2003  . CHOLECYSTECTOMY, LAPAROSCOPIC  01/31/2017  . LAPAROSCOPIC GASTRIC BAND REMOVAL WITH LAPAROSCOPIC GASTRIC SLEEVE RESECTION  04/23/2011  . LAPAROSCOPIC GASTRIC BANDING  2008  . LAPAROSCOPIC GASTRIC RESTRICTIVE DUODENAL PROCEDURE (DUODENAL SWITCH)  01/31/2017    Family History  Problem Relation Age of Onset  . Hypertension Mother   . Diabetes Mother   . Diabetes Brother   . Multiple sclerosis Brother     Social History   Socioeconomic History  . Marital status: Married    Spouse name: [name]  . Number of children: 2  . Years of education: Not on file  . Highest  education level: Bachelor's degree (e.g., BA, AB, BS)  Occupational History  . Occupation: Retired  Scientific laboratory technician  . Financial resource strain: Not hard at all  . Food insecurity:    Worry: Never true    Inability: Never true  . Transportation needs:    Medical: No    Non-medical: No  Tobacco Use  . Smoking status: Former Smoker    Packs/day: 0.50    Years: 8.00    Pack years: 4.00    Types: Cigarettes    Start date: 01/23/1976    Last attempt to quit: 10/17/1984    Years since quitting: 33.4  . Smokeless tobacco: Never Used   . Tobacco comment: smoking cessation materials not required  Substance and Sexual Activity  . Alcohol use: Yes    Alcohol/week: 0.0 standard drinks    Comment: socially  . Drug use: No  . Sexual activity: Yes    Partners: Male    Birth control/protection: Post-menopausal  Lifestyle  . Physical activity:    Days per week: 0 days    Minutes per session: 0 min  . Stress: Not at all  Relationships  . Social connections:    Talks on phone: Patient refused    Gets together: Patient refused    Attends religious service: Patient refused    Active member of club or organization: Patient refused    Attends meetings of clubs or organizations: Patient refused    Relationship status: Married  . Intimate partner violence:    Fear of current or ex partner: No    Emotionally abused: No    Physically abused: No    Forced sexual activity: No  Other Topics Concern  . Not on file  Social History Narrative  . Not on file     Current Outpatient Medications:  .  amLODipine (NORVASC) 5 MG tablet, Take 1 tablet (5 mg total) by mouth daily., Disp: 90 tablet, Rfl: 1 .  aspirin EC 81 MG tablet, Take 81 mg by mouth daily., Disp: , Rfl:  .  atorvastatin (LIPITOR) 20 MG tablet, Take 1 tablet (20 mg total) by mouth daily., Disp: 90 tablet, Rfl: 1 .  Calcium Carb-Cholecalciferol (CALCIUM-VITAMIN D) 500-200 MG-UNIT tablet, Take by mouth., Disp: , Rfl:  .  cholecalciferol (VITAMIN D) 1000 UNITS tablet, Take 1 tablet (1,000 Units total) by mouth daily., Disp: 30 tablet, Rfl: 0 .  febuxostat (ULORIC) 40 MG tablet, Take 1 tablet (40 mg total) by mouth daily., Disp: 90 tablet, Rfl: 1 .  Multiple Vitamin (MULTI-VITAMINS) TABS, Take by mouth., Disp: , Rfl:  .  NEXIUM 40 MG capsule, TAKE 1 CAPSULE BY MOUTH TWO TIMES DAILY, Disp: 180 capsule, Rfl: 3 .  spironolactone (ALDACTONE) 50 MG tablet, Take 1 tablet (50 mg total) by mouth daily., Disp: 90 tablet, Rfl: 1  Allergies  Allergen Reactions  . Pistachio Nut  Extract Skin Test   . Allopurinol Other (See Comments)    rash    I personally reviewed active problem list, medication list, allergies, family history, social history with the patient/caregiver today.   ROS  Constitutional: Negative for fever or weight change.  Respiratory: Negative for cough and shortness of breath.   Cardiovascular: Negative for chest pain or palpitations.  Gastrointestinal: Negative for abdominal pain, no bowel changes.  Musculoskeletal: Negative for gait problem or joint swelling.  Skin: Negative for rash.  Neurological: Negative for dizziness or headache.  No other specific complaints in a complete review of systems (except as listed  in HPI above).   Objective  Vitals:   03/27/18 0832  BP: (!) 142/82  Pulse: 70  Resp: 16  Temp: 98 F (36.7 C)  TempSrc: Oral  SpO2: 99%  Weight: 229 lb 1.6 oz (103.9 kg)  Height: 5\' 5"  (1.651 m)    Body mass index is 38.12 kg/m.  Physical Exam  Constitutional: Patient appears well-developed and well-nourished. Obese  No distress.  HEENT: head atraumatic, normocephalic, pupils equal and reactive to light,  neck supple, throat within normal limits Cardiovascular: Normal rate, regular rhythm and normal heart sounds.  No murmur heard. No BLE edema. Pulmonary/Chest: Effort normal and breath sounds normal. No respiratory distress. Abdominal: Soft.  There is no tenderness. Psychiatric: Patient has a normal mood and affect. behavior is normal. Judgment and thought content normal.   PHQ2/9: Depression screen [facility] 2/9 03/27/2018 11/26/2017 10/08/2017 08/26/2017 12/12/2016  Decreased Interest 0 0 0 0 0  Down, Depressed, Hopeless 0 0 0 0 0  PHQ - 2 Score 0 0 0 0 0  Altered sleeping 0 0 0 - -  Tired, decreased energy 0 0 0 - -  Change in appetite 0 0 0 - -  Feeling bad or failure about yourself  0 0 0 - -  Trouble concentrating 0 0 0 - -  Moving slowly or fidgety/restless 0 0 0 - -  Suicidal thoughts 0 0 0 - -  PHQ-9 Score  0 0 0 - -  Difficult doing work/chores Not difficult at all Not difficult at all Not difficult at all - -   Negative screen   Fall Risk: Fall Risk  03/27/2018 11/26/2017 10/08/2017 08/26/2017 12/24/2016  Falls in the past year? 0 0 No No No  Number falls in past yr: - 0 - - -  Comment - - - - -  Injury with Fall? - 0 - - -  Comment - - - - -  Risk for fall due to : - - Impaired vision;History of fall(s) - -  Risk for fall due to: Comment - - wears eyeglasses; tripped at church - -  Follow up - - - - -     Functional Status Survey: Is the patient deaf or have difficulty hearing?: No Does the patient have difficulty seeing, even when wearing glasses/contacts?: Yes Does the patient have difficulty concentrating, remembering, or making decisions?: No Does the patient have difficulty walking or climbing stairs?: No Does the patient have difficulty dressing or bathing?: No Does the patient have difficulty doing errands alone such as visiting a doctor's office or shopping?: No   Assessment & Plan  1. Hypertension, benign  - amLODipine (NORVASC) 5 MG tablet; Take 1 tablet (5 mg total) by mouth daily.  Dispense: 90 tablet; Refill: 1 - spironolactone (ALDACTONE) 50 MG tablet; Take 1 tablet (50 mg total) by mouth daily.  Dispense: 90 tablet; Refill: 1  2. Controlled gout  - febuxostat (ULORIC) 40 MG tablet; Take 1 tablet (40 mg total) by mouth daily.  Dispense: 90 tablet; Refill: 1  3. Dyslipidemia  - atorvastatin (LIPITOR) 20 MG tablet; Take 1 tablet (20 mg total) by mouth daily.  Dispense: 90 tablet; Refill: 1  4. Morbid obesity ([place])  Discussed with the patient the risk posed by an increased BMI. Discussed importance of portion control, calorie counting and at least 150 minutes of physical activity weekly. Avoid sweet beverages and drink more water. Eat at least 6 servings of fruit and vegetables daily   5. History  of bariatric surgery  Three different types, discussed dietician  referral, and to research intermittent fasting   6. OSA on CPAP  Continue supplementation   7. Vitamin D deficiency  Continue supplementation   8. Colon cancer screening  I will send referral back to Dr Durwin Reges

## 2018-03-27 NOTE — Telephone Encounter (Signed)
Gastroenterology Pre-Procedure Review  Request Date: 04/10/18 Requesting Physician: Dr. Vicente Males  PATIENT REVIEW QUESTIONS: The patient responded to the following health history questions as indicated:    1. Are you having any GI issues? no 2. Do you have a personal history of Polyps? yes (Pt states 4-6 years ago, 2011 colon in chart states normal) 3. Do you have a family history of Colon Cancer or Polyps? no 4. Diabetes Mellitus? no 5. Joint replacements in the past 12 months?no 6. Major health problems in the past 3 months?no 7. Any artificial heart valves, MVP, or defibrillator?no    MEDICATIONS & ALLERGIES:    Patient reports the following regarding taking any anticoagulation/antiplatelet therapy:   Plavix, Coumadin, Eliquis, Xarelto, Lovenox, Pradaxa, Brilinta, or Effient? no Aspirin? yes (81 mg)  Patient confirms/reports the following medications:  Current Outpatient Medications  Medication Sig Dispense Refill  . amLODipine (NORVASC) 5 MG tablet Take 1 tablet (5 mg total) by mouth daily. 90 tablet 1  . aspirin EC 81 MG tablet Take 81 mg by mouth daily.    Marland Kitchen atorvastatin (LIPITOR) 20 MG tablet Take 1 tablet (20 mg total) by mouth daily. 90 tablet 1  . Calcium Carb-Cholecalciferol (CALCIUM-VITAMIN D) 500-200 MG-UNIT tablet Take by mouth.    . cholecalciferol (VITAMIN D) 1000 UNITS tablet Take 1 tablet (1,000 Units total) by mouth daily. 30 tablet 0  . febuxostat (ULORIC) 40 MG tablet Take 1 tablet (40 mg total) by mouth daily. 90 tablet 1  . Multiple Vitamin (MULTI-VITAMINS) TABS Take by mouth.    Marland Kitchen NEXIUM 40 MG capsule TAKE 1 CAPSULE BY MOUTH TWO TIMES DAILY 180 capsule 3  . spironolactone (ALDACTONE) 50 MG tablet Take 1 tablet (50 mg total) by mouth daily. 90 tablet 1   No current facility-administered medications for this visit.     Patient confirms/reports the following allergies:  Allergies  Allergen Reactions  . Pistachio Nut Extract Skin Test   . Allopurinol Other (See  Comments)    rash    No orders of the defined types were placed in this encounter.   AUTHORIZATION INFORMATION Primary Insurance: 1D#: Group #:  Secondary Insurance: 1D#: Group #:  SCHEDULE INFORMATION: Date: 04/10/18 Time: Location:[facility]

## 2018-04-09 ENCOUNTER — Telehealth: Payer: Self-pay

## 2018-04-09 NOTE — Telephone Encounter (Signed)
Patient has returned office call.  She has been informed that due to the [place] we will be canceling her colonoscopy.  We will call her back to reschedule at a later time.  Thanks [employer]

## 2018-04-10 ENCOUNTER — Ambulatory Visit: Admission: RE | Admit: 2018-04-10 | Payer: Medicare Other | Source: Home / Self Care | Admitting: Gastroenterology

## 2018-04-10 ENCOUNTER — Encounter: Admission: RE | Payer: Self-pay | Source: Home / Self Care

## 2018-04-10 SURGERY — COLONOSCOPY WITH PROPOFOL
Anesthesia: General

## 2018-05-12 ENCOUNTER — Telehealth: Payer: Self-pay | Admitting: Family Medicine

## 2018-05-12 NOTE — Telephone Encounter (Signed)
Copied from [place] [id number]. Topic: Quick Communication - See Telephone Encounter >> May 12, 2018  9:24 AM Blase Mess A wrote: CRM for notification. See Telephone encounter for: 05/12/18.  Patient Husband is calling to speak to Dr. Ancil Boozer regarding his wife he feels like it may be early onset of demetia. The patient is forget thing and not able to do simple things and tasks.  Her normal is very busy. This this happened all of a sudden. Please advise [phone]

## 2018-05-12 NOTE — Telephone Encounter (Signed)
Pt husband called about his wife/ I advised Pt that office tried to call he said to call his cell [phone]

## 2018-05-12 NOTE — Telephone Encounter (Signed)
Tried calling to schedule pt an appt and the VM is not setup

## 2018-05-13 ENCOUNTER — Ambulatory Visit (INDEPENDENT_AMBULATORY_CARE_PROVIDER_SITE_OTHER): Payer: Medicare Other | Admitting: Family Medicine

## 2018-05-13 ENCOUNTER — Other Ambulatory Visit: Payer: Self-pay

## 2018-05-13 ENCOUNTER — Encounter: Payer: Self-pay | Admitting: Family Medicine

## 2018-05-13 VITALS — BP 140/100 | HR 76 | Temp 98.0°F | Resp 16 | Ht 65.0 in | Wt 226.9 lb

## 2018-05-13 DIAGNOSIS — E559 Vitamin D deficiency, unspecified: Secondary | ICD-10-CM

## 2018-05-13 DIAGNOSIS — F4321 Adjustment disorder with depressed mood: Secondary | ICD-10-CM | POA: Diagnosis not present

## 2018-05-13 DIAGNOSIS — I10 Essential (primary) hypertension: Secondary | ICD-10-CM

## 2018-05-13 DIAGNOSIS — E538 Deficiency of other specified B group vitamins: Secondary | ICD-10-CM

## 2018-05-13 DIAGNOSIS — R413 Other amnesia: Secondary | ICD-10-CM

## 2018-05-13 NOTE — Progress Notes (Signed)
Name: Shannon Petty   MRN: [id number]    DOB: 1950-01-23   Date:05/13/2018       Progress Note  Subjective  Chief Complaint  Chief Complaint  Patient presents with  . Memory Loss    She states that she is unable to remember alot of things also unable to recall. Onset x 3 weeks, long term memory and constant.    HPI  Memory changes: husband noticed slightly memory changes about one month ago but worse over the past few weeks. She has been sleeping longer than usual, some crying spells, feeling more tired than usual and irritability. She denies feeling depressed, but husband thinks she is, however worse concern is early onset of dementia. She is taking her vitamins and supplements. 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 She usually always on the go. She is now having difficulties using her remote control, also forgetting passwords. Difficulty keeping up with her church meeting. She has noticed changes in her memory once husband started to point it out. She would like to hold off on medication but willing to try counseling. Another change is she has 3 grandchildren at home, they usually in school but since COVID-19 they are home all day. Their daughter and son are in the house also    Patient Active Problem List   Diagnosis Date Noted  . Malabsorption 09/25/2017  . Low magnesium level 09/25/2017  . Low serum vitamin A 09/25/2017  . Elevated parathyroid hormone 08/31/2017  . Barrett esophagus 10/18/2014  . Morbid obesity ([place]) 10/18/2014  . Chronic eczema 10/18/2014  . GERD (gastroesophageal reflux disease) 10/18/2014  . Hypertension, benign 10/18/2014  . Vitamin D deficiency 10/18/2014  . Controlled gout 10/18/2014  . Hyperlipidemia 10/18/2014  . Venous insufficiency 10/18/2014  . OSA on CPAP 10/18/2014  . 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 History of bariatric surgery 04/23/2011     Past Surgical History:  Procedure Laterality Date  . BUNIONECTOMY  2003  . CHOLECYSTECTOMY, LAPAROSCOPIC  01/31/2017  . LAPAROSCOPIC GASTRIC BAND REMOVAL WITH LAPAROSCOPIC GASTRIC SLEEVE RESECTION  04/23/2011  . LAPAROSCOPIC GASTRIC BANDING  2008  . LAPAROSCOPIC GASTRIC RESTRICTIVE DUODENAL PROCEDURE (DUODENAL SWITCH)  01/31/2017    Family History  Problem Relation Age of Onset  . Hypertension Mother   . Diabetes Mother   . Diabetes Brother   . Multiple sclerosis Brother     Social History   Socioeconomic History  . Marital status: Married    Spouse name: [name]  . Number of children: 2  . Years of education: Not on file  . Highest education level: Bachelor's degree (e.g., BA, AB, BS)  Occupational History  . Occupation: Retired  Scientific laboratory technician  . Financial resource strain: Not hard at all  . Food insecurity:    Worry: Never true    Inability: Never true  . Transportation needs:    Medical: No    Non-medical: No  Tobacco Use  . Smoking status: Former Smoker    Packs/day: 0.50    Years: 8.00    Pack years: 4.00    Types: Cigarettes    Start date: 01/23/1976    Last attempt to quit: 10/17/1984    Years since quitting: 33.5  . Smokeless tobacco: Never Used  . Tobacco comment: smoking cessation materials not required  Substance and Sexual Activity  . Alcohol use: Yes    Alcohol/week: 0.0 standard drinks  Comment: socially  . Drug use: No  . Sexual activity: Yes    Partners: Male    Birth control/protection: Post-menopausal  Lifestyle  . Physical activity:    Days per week: 0 days    Minutes per session: 0 min  . Stress: Not at all  Relationships  . Social connections:    Talks on phone: Patient refused    Gets together: Patient refused    Attends religious service: Patient refused    Active member of club or organization: Patient refused    Attends meetings of clubs or organizations: Patient refused    Relationship status: Married  . Intimate partner  violence:    Fear of current or ex partner: No    Emotionally abused: No    Physically abused: No    Forced sexual activity: No  Other Topics Concern  . Not on file  Social History Narrative  . Not on file     Current Outpatient Medications:  .  amLODipine (NORVASC) 5 MG tablet, Take 1 tablet (5 mg total) by mouth daily., Disp: 90 tablet, Rfl: 1 .  aspirin EC 81 MG tablet, Take 81 mg by mouth daily., Disp: , Rfl:  .  atorvastatin (LIPITOR) 20 MG tablet, Take 1 tablet (20 mg total) by mouth daily., Disp: 90 tablet, Rfl: 1 .  Calcium Carb-Cholecalciferol (CALCIUM-VITAMIN D) 500-200 MG-UNIT tablet, Take by mouth., Disp: , Rfl:  .  cholecalciferol (VITAMIN D) 1000 UNITS tablet, Take 1 tablet (1,000 Units total) by mouth daily., Disp: 30 tablet, Rfl: 0 .  febuxostat (ULORIC) 40 MG tablet, Take 1 tablet (40 mg total) by mouth daily., Disp: 90 tablet, Rfl: 1 .  Multiple Vitamin (MULTI-VITAMINS) TABS, Take by mouth., Disp: , Rfl:  .  NEXIUM 40 MG capsule, TAKE 1 CAPSULE BY MOUTH TWO TIMES DAILY, Disp: 180 capsule, Rfl: 3 .  spironolactone (ALDACTONE) 50 MG tablet, Take 1 tablet (50 mg total) by mouth daily., Disp: 90 tablet, Rfl: 1  Allergies  Allergen Reactions  . Pistachio Nut Extract Skin Test   . Allopurinol Other (See Comments)    rash    I personally reviewed active problem list, medication list, allergies, family history with the patient/caregiver today.   ROS  Ten systems reviewed and is negative except as mentioned in HPI   Objective  Vitals:   05/13/18 0857  BP: (!) 140/100  Pulse: 76  Resp: 16  Temp: 98 F (36.7 C)  TempSrc: Oral  SpO2: 96%  Weight: 226 lb 14.4 oz (102.9 kg)  Height: 5\' 5"  (1.651 m)    Body mass index is 37.76 kg/m.  Physical Exam  Constitutional: Patient appears well-developed and well-nourished. Obese  No distress.  HEENT: head atraumatic, normocephalic, pupils equal and reactive to light,  neck supple, throat within normal  limits Cardiovascular: Normal rate, regular rhythm and normal heart sounds.  No murmur heard. No BLE edema. Pulmonary/Chest: Effort normal and breath sounds normal. No respiratory distress. Abdominal: Soft.  There is no tenderness. Psychiatric: Patient has a flat affect, not crying, but seems frustrated with husband  PHQ2/9: Depression screen [facility] 2/9 05/13/2018 03/27/2018 11/26/2017 10/08/2017 08/26/2017  Decreased Interest 3 0 0 0 0  Down, Depressed, Hopeless 2 0 0 0 0  PHQ - 2 Score 5 0 0 0 0  Altered sleeping 3 0 0 0 -  Tired, decreased energy 3 0 0 0 -  Change in appetite 0 0 0 0 -  Feeling bad or failure about yourself  0 0 0 0 -  Trouble concentrating 0 0 0 0 -  Moving slowly or fidgety/restless 0 0 0 0 -  Suicidal thoughts 0 0 0 0 -  PHQ-9 Score 11 0 0 0 -  Difficult doing work/chores Somewhat difficult Not difficult at all Not difficult at all Not difficult at all -    phq 9 is positive   Fall Risk: Fall Risk  05/13/2018 03/27/2018 11/26/2017 10/08/2017 08/26/2017  Falls in the past year? 0 0 0 No No  Number falls in past yr: 0 - 0 - -  Comment - - - - -  Injury with Fall? 0 - 0 - -  Comment - - - - -  Risk for fall due to : - - - Impaired vision;History of fall(s) -  Risk for fall due to: Comment - - - wears eyeglasses; tripped at church -  Follow up - - - - -     Functional Status Survey: Is the patient deaf or have difficulty hearing?: No Does the patient have difficulty seeing, even when wearing glasses/contacts?: No Does the patient have difficulty concentrating, remembering, or making decisions?: Yes Does the patient have difficulty walking or climbing stairs?: No Does the patient have difficulty dressing or bathing?: No Does the patient have difficulty doing errands alone such as visiting a doctor's office or shopping?: Yes    Assessment & Plan  1. Hypertension, benign  - CBC with Differential/Platelet - COMPLETE METABOLIC PANEL WITH GFR - Thyroid Panel With  TSH  2. Memory changes  - Thyroid Panel With TSH - Urine Culture - Ambulatory referral to Neurology  3. Situational depression  - Ambulatory referral to Psychology  4. B12 deficiency  - B12 and Folate Panel - CBC with Differential/Platelet  5. Vitamin D deficiency  - VITAMIN D 25 Hydroxy (Vit-D Deficiency, Fractures)

## 2018-05-13 NOTE — Telephone Encounter (Signed)
Appt set for 05-13-2018

## 2018-05-14 LAB — CBC WITH DIFFERENTIAL/PLATELET
Absolute Monocytes: 383 cells/uL (ref 200–950)
Basophils Absolute: 57 cells/uL (ref 0–200)
Basophils Relative: 0.8 %
Eosinophils Absolute: 170 cells/uL (ref 15–500)
Eosinophils Relative: 2.4 %
HCT: 42.7 % (ref 35.0–45.0)
Hemoglobin: 14.4 g/dL (ref 11.7–15.5)
Lymphs Abs: 1846 cells/uL (ref 850–3900)
MCH: 30.8 pg (ref 27.0–33.0)
MCHC: 33.7 g/dL (ref 32.0–36.0)
MCV: 91.4 fL (ref 80.0–100.0)
MPV: 9.8 fL (ref 7.5–12.5)
Monocytes Relative: 5.4 %
Neutro Abs: 4643 cells/uL (ref 1500–7800)
Neutrophils Relative %: 65.4 %
Platelets: 327 10*3/uL (ref 140–400)
RBC: 4.67 10*6/uL (ref 3.80–5.10)
RDW: 13.3 % (ref 11.0–15.0)
Total Lymphocyte: 26 %
WBC: 7.1 10*3/uL (ref 3.8–10.8)

## 2018-05-14 LAB — COMPLETE METABOLIC PANEL WITHOUT GFR
AG Ratio: 1.6 (calc) (ref 1.0–2.5)
ALT: 17 U/L (ref 6–29)
AST: 20 U/L (ref 10–35)
Albumin: 4 g/dL (ref 3.6–5.1)
Alkaline phosphatase (APISO): 115 U/L (ref 37–153)
BUN/Creatinine Ratio: 17 (calc) (ref 6–22)
BUN: 18 mg/dL (ref 7–25)
CO2: 26 mmol/L (ref 20–32)
Calcium: 9 mg/dL (ref 8.6–10.4)
Chloride: 110 mmol/L (ref 98–110)
Creat: 1.03 mg/dL — ABNORMAL HIGH (ref 0.50–0.99)
GFR, Est African American: 65 mL/min/1.73m2
GFR, Est Non African American: 56 mL/min/1.73m2 — ABNORMAL LOW
Globulin: 2.5 g/dL (ref 1.9–3.7)
Glucose, Bld: 96 mg/dL (ref 65–99)
Potassium: 4.2 mmol/L (ref 3.5–5.3)
Sodium: 143 mmol/L (ref 135–146)
Total Bilirubin: 0.5 mg/dL (ref 0.2–1.2)
Total Protein: 6.5 g/dL (ref 6.1–8.1)

## 2018-05-14 LAB — THYROID PANEL WITH TSH
Free Thyroxine Index: 2.4 (ref 1.4–3.8)
T3 Uptake: 28 % (ref 22–35)
T4, Total: 8.7 ug/dL (ref 5.1–11.9)
TSH: 1.83 m[IU]/L (ref 0.40–4.50)

## 2018-05-14 LAB — B12 AND FOLATE PANEL
Folate: 8.5 ng/mL
Vitamin B-12: 1055 pg/mL (ref 200–1100)

## 2018-05-14 LAB — VITAMIN D 25 HYDROXY (VIT D DEFICIENCY, FRACTURES): Vit D, 25-Hydroxy: 30 ng/mL (ref 30–100)

## 2018-05-19 NOTE — Telephone Encounter (Unsigned)
Copied from [place] [id number]. Topic: General - Other >> May 19, 2018 12:31 PM Alanda Slim E wrote: Reason for CRM: Pt's husband called about wifes lab results and wanted to speak with dr Ancil Boozer about on set dementia. He is concerned for her/ please advise

## 2018-05-23 ENCOUNTER — Ambulatory Visit (INDEPENDENT_AMBULATORY_CARE_PROVIDER_SITE_OTHER): Payer: Medicare Other | Admitting: Family Medicine

## 2018-05-23 ENCOUNTER — Other Ambulatory Visit: Payer: Self-pay

## 2018-05-23 ENCOUNTER — Encounter: Payer: Self-pay | Admitting: Family Medicine

## 2018-05-23 VITALS — BP 132/70 | HR 67

## 2018-05-23 DIAGNOSIS — I10 Essential (primary) hypertension: Secondary | ICD-10-CM

## 2018-05-23 DIAGNOSIS — F4321 Adjustment disorder with depressed mood: Secondary | ICD-10-CM | POA: Diagnosis not present

## 2018-05-23 DIAGNOSIS — R413 Other amnesia: Secondary | ICD-10-CM | POA: Diagnosis not present

## 2018-05-23 MED ORDER — ESCITALOPRAM OXALATE 10 MG PO TABS: 10.0000 mg | ORAL_TABLET | Freq: Every day | ORAL | 0 refills | Status: DC

## 2018-05-23 NOTE — Progress Notes (Signed)
Name: Shannon Petty   MRN: [id number]    DOB: [birth year]   Date:05/23/2018       Progress Note  Subjective  Chief Complaint  Chief Complaint  Patient presents with  . Memory Loss    I connected with  Shannon Petty  on 05/23/18 at  8:40 AM EDT by a video enabled telemedicine application and verified that I am speaking with the correct person using two identifiers.  I discussed the limitations of evaluation and management by telemedicine and the availability of in person appointments. The patient expressed understanding and agreed to proceed. Staff also discussed with the patient that there may be a patient responsible charge related to this service. Patient Location: at home with her husband Provider Location: [facility]    HPI  Memory changes: husband noticed slightly memory changes about 6 weeks  ago but worse over the past few weeks. She has been sleeping longer than usual, some crying spells, feeling more tired than usual and irritability. She denies feeling depressed, but husband thinks she is, however worse concern is early onset of dementia. She is taking her vitamins and supplements. 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 She is usually always on the go. She is now having difficulties using her remote control, also forgetting passwords. 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 She has noticed changes in her memory once husband started to point it out. She would like to hold off on medication but willing to try counseling. Another change is she has 3 grandchildren are  at home, they usually are in  school but since COVID-19 they are home all day. Their daughter and son also live with them. On her last visit 10 days ago we discussed symptoms of depression, checked labs and explained dementia has a more  gradual onset. Referral was placed for neurologist but appointment has not been made yet. We offered medication for depression and today she agrees on starting medication, husband contacted the insurance and got names of psychologist in network  HTN: bp was high on her last visit, she will drive by for a recheck today. She denies headaches, dizziness or blurred vision  Patient Active Problem List   Diagnosis Date Noted  . Malabsorption 09/25/2017  . Low magnesium level 09/25/2017  . Low serum vitamin A 09/25/2017  . Elevated parathyroid hormone 08/31/2017  . Barrett esophagus 10/18/2014  . Morbid obesity ([place]) 10/18/2014  . Chronic eczema 10/18/2014  . GERD (gastroesophageal reflux disease) 10/18/2014  . Hypertension, benign 10/18/2014  . Vitamin D deficiency 10/18/2014  . Controlled gout 10/18/2014  . Hyperlipidemia 10/18/2014  . Venous insufficiency 10/18/2014  . OSA on CPAP 10/18/2014  . 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 History of bariatric surgery 04/23/2011    Past Surgical History:  Procedure Laterality Date  . BUNIONECTOMY  2003  . CHOLECYSTECTOMY, LAPAROSCOPIC  01/31/2017  . LAPAROSCOPIC GASTRIC BAND REMOVAL WITH LAPAROSCOPIC GASTRIC SLEEVE RESECTION  04/23/2011  . LAPAROSCOPIC GASTRIC BANDING  2008  . LAPAROSCOPIC GASTRIC RESTRICTIVE DUODENAL PROCEDURE (DUODENAL SWITCH)  01/31/2017    Family History  Problem Relation Age of Onset  . Hypertension Mother   . Diabetes Mother   . Diabetes Brother   . Multiple sclerosis Brother     Social History  Socioeconomic History  . Marital status: Married    Spouse name: [name]  . Number of children: 2  . Years of education: Not on file  . Highest education level: Bachelor's degree (e.g., BA, AB, BS)  Occupational History  . Occupation: Retired  Scientific laboratory technician  . Financial resource strain: Not hard at all  . Food insecurity:    Worry: Never true    Inability: Never true  .  Transportation needs:    Medical: No    Non-medical: No  Tobacco Use  . Smoking status: Former Smoker    Packs/day: 0.50    Years: 8.00    Pack years: 4.00    Types: Cigarettes    Start date: 01/23/1976    Last attempt to quit: 10/17/1984    Years since quitting: 33.6  . Smokeless tobacco: Never Used  . Tobacco comment: smoking cessation materials not required  Substance and Sexual Activity  . Alcohol use: Yes    Alcohol/week: 0.0 standard drinks    Comment: socially  . Drug use: No  . Sexual activity: Yes    Partners: Male    Birth control/protection: Post-menopausal  Lifestyle  . Physical activity:    Days per week: 0 days    Minutes per session: 0 min  . Stress: Not at all  Relationships  . Social connections:    Talks on phone: Patient refused    Gets together: Patient refused    Attends religious service: Patient refused    Active member of club or organization: Patient refused    Attends meetings of clubs or organizations: Patient refused    Relationship status: Married  . Intimate partner violence:    Fear of current or ex partner: No    Emotionally abused: No    Physically abused: No    Forced sexual activity: No  Other Topics Concern  . Not on file  Social History Narrative  . Not on file     Current Outpatient Medications:  .  amLODipine (NORVASC) 5 MG tablet, Take 1 tablet (5 mg total) by mouth daily., Disp: 90 tablet, Rfl: 1 .  aspirin EC 81 MG tablet, Take 81 mg by mouth daily., Disp: , Rfl:  .  atorvastatin (LIPITOR) 20 MG tablet, Take 1 tablet (20 mg total) by mouth daily., Disp: 90 tablet, Rfl: 1 .  Calcium Carb-Cholecalciferol (CALCIUM-VITAMIN D) 500-200 MG-UNIT tablet, Take by mouth., Disp: , Rfl:  .  cholecalciferol (VITAMIN D) 1000 UNITS tablet, Take 1 tablet (1,000 Units total) by mouth daily., Disp: 30 tablet, Rfl: 0 .  febuxostat (ULORIC) 40 MG tablet, Take 1 tablet (40 mg total) by mouth daily., Disp: 90 tablet, Rfl: 1 .  Multiple Vitamin  (MULTI-VITAMINS) TABS, Take by mouth., Disp: , Rfl:  .  NEXIUM 40 MG capsule, TAKE 1 CAPSULE BY MOUTH TWO TIMES DAILY, Disp: 180 capsule, Rfl: 3 .  spironolactone (ALDACTONE) 50 MG tablet, Take 1 tablet (50 mg total) by mouth daily., Disp: 90 tablet, Rfl: 1  Allergies  Allergen Reactions  . Pistachio Nut Extract Skin Test   . Allopurinol Other (See Comments)    rash    I personally reviewed active problem list, medication list, allergies, family history, social history with the patient/caregiver today.   ROS  Ten systems reviewed and is negative except as mentioned in HPI   Objective  Virtual encounter, vitals obtained from her car  There is no height or weight on file to calculate BMI.  Physical Exam  Awake,  alert and oriented , but not talking as her usual self   PHQ2/9: Depression screen [facility] 2/9 05/23/2018 05/13/2018 03/27/2018 11/26/2017 10/08/2017  Decreased Interest 3 3 0 0 0  Down, Depressed, Hopeless 0 2 0 0 0  PHQ - 2 Score 3 5 0 0 0  Altered sleeping 2 3 0 0 0  Tired, decreased energy 2 3 0 0 0  Change in appetite 2 0 0 0 0  Feeling bad or failure about yourself  0 0 0 0 0  Trouble concentrating 2 0 0 0 0  Moving slowly or fidgety/restless 3 0 0 0 0  Suicidal thoughts 0 0 0 0 0  PHQ-9 Score 14 11 0 0 0  Difficult doing work/chores Extremely dIfficult Somewhat difficult Not difficult at all Not difficult at all Not difficult at all   PHQ-2/9 Result is positive.    Fall Risk: Fall Risk  05/13/2018 03/27/2018 11/26/2017 10/08/2017 08/26/2017  Falls in the past year? 0 0 0 No No  Number falls in past yr: 0 - 0 - -  Comment - - - - -  Injury with Fall? 0 - 0 - -  Comment - - - - -  Risk for fall due to : - - - Impaired vision;History of fall(s) -  Risk for fall due to: Comment - - - wears eyeglasses; tripped at church -  Follow up - - - - -     [place]  1. Memory changes  She needs to see neurologist we will follow up on referral   2. Situational  depression  Advised husband to contact the counselor  - escitalopram (LEXAPRO) 10 MG tablet; Take 1 tablet (10 mg total) by mouth daily.  Dispense: 30 tablet; Refill: 0  3. Hypertension, benign  She will drive by for bp check and it was at goal   I discussed the assessment and treatment plan with the patient. The patient was provided an opportunity to ask questions and all were answered. The patient agreed with the plan and demonstrated an understanding of the instructions.  The patient was advised to call back or seek an in-person evaluation if the symptoms worsen or if the condition fails to improve as anticipated.  I provided 25  minutes of non-face-to-face time during this encounter.

## 2018-05-26 ENCOUNTER — Ambulatory Visit (INDEPENDENT_AMBULATORY_CARE_PROVIDER_SITE_OTHER): Payer: Medicare Other | Admitting: Family Medicine

## 2018-05-26 ENCOUNTER — Other Ambulatory Visit: Payer: Self-pay

## 2018-05-26 ENCOUNTER — Encounter: Payer: Self-pay | Admitting: Family Medicine

## 2018-05-26 ENCOUNTER — Other Ambulatory Visit: Payer: Self-pay | Admitting: Family Medicine

## 2018-05-26 ENCOUNTER — Ambulatory Visit
Admission: RE | Admit: 2018-05-26 | Discharge: 2018-05-26 | Disposition: A | Discharge status: Home / Self Care | Payer: Medicare Other | Source: Ambulatory Visit | Attending: Family Medicine | Admitting: Family Medicine

## 2018-05-26 VITALS — BP 144/76 | HR 72 | Temp 98.0°F | Resp 16 | Ht 65.0 in | Wt 226.2 lb

## 2018-05-26 DIAGNOSIS — R413 Other amnesia: Secondary | ICD-10-CM

## 2018-05-26 DIAGNOSIS — R404 Transient alteration of awareness: Secondary | ICD-10-CM

## 2018-05-26 DIAGNOSIS — G939 Disorder of brain, unspecified: Secondary | ICD-10-CM | POA: Diagnosis not present

## 2018-05-26 NOTE — Progress Notes (Signed)
Name: Shannon Petty   MRN: [id number]    DOB: 10-28-1950   Date:05/26/2018       Progress Note  Subjective  Chief Complaint  Chief Complaint  Patient presents with  . Results    CT Results  . Memory Changes    HPI  Lesion frontal lobe: husband brought her in on Friday and was explaining that she had difficulty writing a check, and needed multiple trials. She is a very independent person. We decided to go ahead and order a CT to rule out mass or stroke, she had it done today and it showed a large mass. Spoke to radiologist who recommended MRI brain with and without contrast, we will also place referral to [facility] neurosurgeon for evaluation as requested by her husband and patient.    Patient Active Problem List   Diagnosis Date Noted  . Malabsorption 09/25/2017  . Low magnesium level 09/25/2017  . Low serum vitamin A 09/25/2017  . Elevated parathyroid hormone 08/31/2017  . Barrett esophagus 10/18/2014  . Morbid obesity ([place]) 10/18/2014  . Chronic eczema 10/18/2014  . GERD (gastroesophageal reflux disease) 10/18/2014  . Hypertension, benign 10/18/2014  . Vitamin D deficiency 10/18/2014  . Controlled gout 10/18/2014  . Hyperlipidemia 10/18/2014  . Venous insufficiency 10/18/2014  . OSA on CPAP 10/18/2014  . 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 History of bariatric surgery 04/23/2011    Past Surgical History:  Procedure Laterality Date  . BUNIONECTOMY  2003  . CHOLECYSTECTOMY, LAPAROSCOPIC  01/31/2017  . LAPAROSCOPIC GASTRIC BAND REMOVAL WITH LAPAROSCOPIC GASTRIC SLEEVE RESECTION  04/23/2011  . LAPAROSCOPIC GASTRIC BANDING  2008  . LAPAROSCOPIC GASTRIC RESTRICTIVE DUODENAL PROCEDURE (DUODENAL SWITCH)  01/31/2017    Family History  Problem Relation Age of Onset  . Hypertension Mother   . Diabetes Mother   . Diabetes Brother   . Multiple sclerosis Brother     Social History   Socioeconomic History  . Marital status: Married     Spouse name: [name]  . Number of children: 2  . Years of education: Not on file  . Highest education level: Bachelor's degree (e.g., BA, AB, BS)  Occupational History  . Occupation: Retired  Scientific laboratory technician  . Financial resource strain: Not hard at all  . Food insecurity:    Worry: Never true    Inability: Never true  . Transportation needs:    Medical: No    Non-medical: No  Tobacco Use  . Smoking status: Former Smoker    Packs/day: 0.50    Years: 8.00    Pack years: 4.00    Types: Cigarettes    Start date: 01/23/1976    Last attempt to quit: 10/17/1984    Years since quitting: 33.6  . Smokeless tobacco: Never Used  . Tobacco comment: smoking cessation materials not required  Substance and Sexual Activity  . Alcohol use: Yes    Alcohol/week: 0.0 standard drinks    Comment: socially  . Drug use: No  . Sexual activity: Yes    Partners: Male    Birth control/protection: Post-menopausal  Lifestyle  . Physical activity:    Days per week: 0 days    Minutes per session: 0 min  . Stress: Not at all  Relationships  . Social connections:    Talks on phone: Patient refused    Gets together: Patient refused    Attends religious service: Patient refused    Active member of club or organization: Patient refused  Attends meetings of clubs or organizations: Patient refused    Relationship status: Married  . Intimate partner violence:    Fear of current or ex partner: No    Emotionally abused: No    Physically abused: No    Forced sexual activity: No  Other Topics Concern  . Not on file  Social History Narrative  . Not on file     Current Outpatient Medications:  .  amLODipine (NORVASC) 5 MG tablet, Take 1 tablet (5 mg total) by mouth daily., Disp: 90 tablet, Rfl: 1 .  aspirin EC 81 MG tablet, Take 81 mg by mouth daily., Disp: , Rfl:  .  atorvastatin (LIPITOR) 20 MG tablet, Take 1 tablet (20 mg total) by mouth daily., Disp: 90 tablet, Rfl: 1 .  Calcium Carb-Cholecalciferol  (CALCIUM-VITAMIN D) 500-200 MG-UNIT tablet, Take by mouth., Disp: , Rfl:  .  cholecalciferol (VITAMIN D) 1000 UNITS tablet, Take 1 tablet (1,000 Units total) by mouth daily., Disp: 30 tablet, Rfl: 0 .  escitalopram (LEXAPRO) 10 MG tablet, Take 1 tablet (10 mg total) by mouth daily., Disp: 30 tablet, Rfl: 0 .  febuxostat (ULORIC) 40 MG tablet, Take 1 tablet (40 mg total) by mouth daily., Disp: 90 tablet, Rfl: 1 .  Multiple Vitamin (MULTI-VITAMINS) TABS, Take by mouth., Disp: , Rfl:  .  NEXIUM 40 MG capsule, TAKE 1 CAPSULE BY MOUTH TWO TIMES DAILY, Disp: 180 capsule, Rfl: 3 .  spironolactone (ALDACTONE) 50 MG tablet, Take 1 tablet (50 mg total) by mouth daily., Disp: 90 tablet, Rfl: 1  Allergies  Allergen Reactions  . Pistachio Nut Extract Skin Test   . Allopurinol Other (See Comments)    rash    I personally reviewed active problem list, medication list, allergies, family history, social history with the patient/caregiver today.   ROS  Ten systems reviewed and is negative except as mentioned in HPI   Objective  Vitals:   05/26/18 1358  BP: (!) 144/76  Pulse: 72  Resp: 16  Temp: 98 F (36.7 C)  TempSrc: Oral  SpO2: 99%  Weight: 226 lb 3.2 oz (102.6 kg)  Height: 5\' 5"  (1.651 m)    Body mass index is 37.64 kg/m.  Physical Exam  Constitutional: Patient appears well-developed and well-nourished. Obese  No distress.  HEENT: head atraumatic, normocephalic, pupils equal and reactive to light, neck supple, throat within normal limits Cardiovascular: Normal rate, regular rhythm and normal heart sounds.  No murmur heard. No BLE edema. Pulmonary/Chest: Effort normal and breath sounds normal. No respiratory distress. Abdominal: Soft.  There is no tenderness. Neurological: normal strength, cranial nerves negative Psychiatric: Patient is calm, not sure if she understands.   Recent Results (from the past 2160 hour(s))  B12 and Folate Panel     Status: None   Collection Time:  05/13/18  9:46 AM  Result Value Ref Range   Vitamin B-12 1,055 200 - 1,100 pg/mL   Folate 8.5 ng/mL    Comment:                            Reference Range                            Low:           <3.4  Borderline:    3.4-5.4                            Normal:        >5.4 .   CBC with Differential/Platelet     Status: None   Collection Time: 05/13/18  9:46 AM  Result Value Ref Range   WBC 7.1 3.8 - 10.8 Thousand/uL   RBC 4.67 3.80 - 5.10 Million/uL   Hemoglobin 14.4 11.7 - 15.5 g/dL   HCT 42.7 35.0 - 45.0 %   MCV 91.4 80.0 - 100.0 fL   MCH 30.8 27.0 - 33.0 pg   MCHC 33.7 32.0 - 36.0 g/dL   RDW 13.3 11.0 - 15.0 %   Platelets 327 140 - 400 Thousand/uL   MPV 9.8 7.5 - 12.5 fL   Neutro Abs 4,643 1,500 - 7,800 cells/uL   Lymphs Abs 1,846 850 - 3,900 cells/uL   Absolute Monocytes 383 200 - 950 cells/uL   Eosinophils Absolute 170 15 - 500 cells/uL   Basophils Absolute 57 0 - 200 cells/uL   Neutrophils Relative % 65.4 %   Total Lymphocyte 26.0 %   Monocytes Relative 5.4 %   Eosinophils Relative 2.4 %   Basophils Relative 0.8 %  COMPLETE METABOLIC PANEL WITH GFR     Status: Abnormal   Collection Time: 05/13/18  9:46 AM  Result Value Ref Range   Glucose, Bld 96 65 - 99 mg/dL    Comment: .            Fasting reference interval .    BUN 18 7 - 25 mg/dL   Creat 1.03 (H) 0.50 - 0.99 mg/dL    Comment: For patients >21 years of age, the reference limit for Creatinine is approximately 13% higher for people identified as African-American. .    GFR, Est Non African American 56 (L) > OR = 60 mL/min/1.26m2   GFR, Est African American 65 > OR = 60 mL/min/1.35m2   BUN/Creatinine Ratio 17 6 - 22 (calc)   Sodium 143 135 - 146 mmol/L   Potassium 4.2 3.5 - 5.3 mmol/L   Chloride 110 98 - 110 mmol/L   CO2 26 20 - 32 mmol/L   Calcium 9.0 8.6 - 10.4 mg/dL   Total Protein 6.5 6.1 - 8.1 g/dL   Albumin 4.0 3.6 - 5.1 g/dL   Globulin 2.5 1.9 - 3.7 g/dL (calc)   AG  Ratio 1.6 1.0 - 2.5 (calc)   Total Bilirubin 0.5 0.2 - 1.2 mg/dL   Alkaline phosphatase (APISO) 115 37 - 153 U/L   AST 20 10 - 35 U/L   ALT 17 6 - 29 U/L  VITAMIN D 25 Hydroxy (Vit-D Deficiency, Fractures)     Status: None   Collection Time: 05/13/18  9:46 AM  Result Value Ref Range   Vit D, 25-Hydroxy 30 30 - 100 ng/mL    Comment: Vitamin D Status         25-OH Vitamin D: . Deficiency:                    <20 ng/mL Insufficiency:             20 - 29 ng/mL Optimal:                 > or = 30 ng/mL . For 25-OH Vitamin D testing on patients on  D2-supplementation and patients for whom quantitation  of D2 and D3 fractions is required, the QuestAssureD(TM) 25-OH VIT D, (D2,D3), LC/MS/MS is recommended: order  code [phone] (patients >85yrs). . For more information on this test, go to: http://education.questdiagnostics.com/faq/FAQ163 (This link is being provided for  informational/educational purposes only.)   Thyroid Panel With TSH     Status: None   Collection Time: 05/13/18  9:46 AM  Result Value Ref Range   T3 Uptake 28 22 - 35 %   T4, Total 8.7 5.1 - 11.9 mcg/dL   Free Thyroxine Index 2.4 1.4 - 3.8   TSH 1.83 0.40 - 4.50 mIU/L     PHQ2/9: Depression screen [facility] 2/9 05/23/2018 05/13/2018 03/27/2018 11/26/2017 10/08/2017  Decreased Interest 3 3 0 0 0  Down, Depressed, Hopeless 0 2 0 0 0  PHQ - 2 Score 3 5 0 0 0  Altered sleeping 2 3 0 0 0  Tired, decreased energy 2 3 0 0 0  Change in appetite 2 0 0 0 0  Feeling bad or failure about yourself  0 0 0 0 0  Trouble concentrating 2 0 0 0 0  Moving slowly or fidgety/restless 3 0 0 0 0  Suicidal thoughts 0 0 0 0 0  PHQ-9 Score 14 11 0 0 0  Difficult doing work/chores Extremely dIfficult Somewhat difficult Not difficult at all Not difficult at all Not difficult at all    phq 9 is positive   Fall Risk: Fall Risk  05/26/2018 05/13/2018 03/27/2018 11/26/2017 10/08/2017  Falls in the past year? 0 0 0 0 No  Number falls in past yr: 0 0 - 0 -   Comment - - - - -  Injury with Fall? 0 0 - 0 -  Comment - - - - -  Risk for fall due to : - - - - Impaired vision;History of fall(s)  Risk for fall due to: Comment - - - - wears eyeglasses; tripped at church  Follow up - - - - -     Assessment & Plan  1. 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

## 2018-05-27 ENCOUNTER — Ambulatory Visit
Admission: RE | Admit: 2018-05-27 | Discharge: 2018-05-27 | Disposition: A | Discharge status: Home / Self Care | Payer: Medicare Other | Source: Ambulatory Visit | Attending: Family Medicine | Admitting: Family Medicine

## 2018-05-27 DIAGNOSIS — G939 Disorder of brain, unspecified: Secondary | ICD-10-CM | POA: Insufficient documentation

## 2018-05-27 MED ORDER — GADOBUTROL 1 MMOL/ML IV SOLN
10.0000 mL | Freq: Once | INTRAVENOUS | Status: AC | PRN
  Administered 2018-05-27: 10 mL via INTRAVENOUS

## 2018-05-28 ENCOUNTER — Other Ambulatory Visit: Payer: Self-pay | Admitting: Family Medicine

## 2018-05-28 NOTE — Telephone Encounter (Unsigned)
Copied from [place] [id number]. Topic: General - Inquiry >> May 28, 2018  2:40 PM Berneta Levins wrote: Reason for CRM:   Pt's spouse calling to see if test results are back from yesterday.

## 2018-06-12 HISTORY — PX: BRAIN TUMOR EXCISION: SHX577

## 2018-06-13 DIAGNOSIS — C719 Malignant neoplasm of brain, unspecified: Secondary | ICD-10-CM | POA: Insufficient documentation

## 2018-06-14 ENCOUNTER — Other Ambulatory Visit: Payer: Self-pay | Admitting: Family Medicine

## 2018-06-14 DIAGNOSIS — F4321 Adjustment disorder with depressed mood: Secondary | ICD-10-CM

## 2018-06-23 ENCOUNTER — Ambulatory Visit: Payer: Medicare Other | Admitting: Family Medicine

## 2018-07-09 ENCOUNTER — Other Ambulatory Visit: Payer: Self-pay | Admitting: Family Medicine

## 2018-07-09 DIAGNOSIS — F4321 Adjustment disorder with depressed mood: Secondary | ICD-10-CM

## 2018-07-10 NOTE — Telephone Encounter (Signed)
appt scheduled for next Tuesday and she would like a refill on this medication. Also husband stated that he received a call about [facility] wanting to extend her care and he stated that she really need it extended. Please return his call to discuss further

## 2018-07-10 NOTE — Telephone Encounter (Signed)
Copied from [place] [phone]. Topic: General - Other >> Jul 09, 2018 10:21 AM Nils Flack wrote: Reason for CRM: husband Shannon Petty is calling back from the day before yesterday.  He says it is about [facility] home [facility].  Please call [phone]

## 2018-07-10 NOTE — Telephone Encounter (Signed)
Spoke with [place] husband [name] and he states he wanted to get her Physical Therapy extend since it is set to be discontinued on Friday. However, [name] states she has Managed care as her payer and PT has to have a medical valid reason for her insurance to keep paying. Patient is scheduled for appointment on Tuesday and will discuss this further.

## 2018-07-15 ENCOUNTER — Encounter: Payer: Self-pay | Admitting: Family Medicine

## 2018-07-15 ENCOUNTER — Other Ambulatory Visit: Payer: Self-pay

## 2018-07-15 ENCOUNTER — Ambulatory Visit (INDEPENDENT_AMBULATORY_CARE_PROVIDER_SITE_OTHER): Payer: Medicare Other | Admitting: Family Medicine

## 2018-07-15 DIAGNOSIS — F4321 Adjustment disorder with depressed mood: Secondary | ICD-10-CM

## 2018-07-15 DIAGNOSIS — C711 Malignant neoplasm of frontal lobe: Secondary | ICD-10-CM

## 2018-07-15 MED ORDER — ESCITALOPRAM OXALATE 10 MG PO TABS: 10.0000 mg | ORAL_TABLET | Freq: Every day | ORAL | 0 refills | Status: DC

## 2018-07-15 NOTE — Progress Notes (Signed)
Name: Shannon Petty   MRN: [id number]    DOB: [birth year]   Date:07/15/2018       Progress Note  Subjective  Chief Complaint  Chief Complaint  Patient presents with  . Depression    I connected with  [name]  on 07/15/18 at  2:20 PM EDT by a video enabled telemedicine application and verified that I am speaking with the correct person using two identifiers.  I discussed the limitations of evaluation and management by telemedicine and the availability of in person appointments. The patient expressed understanding and agreed to proceed. Staff also discussed with the patient that there may be a patient responsible charge related to this service. Patient Location: home Provider Location: [facility]  Additional Individuals present: husband  HPI  Brain Cancer: going to [facility], under the care of oncologis Dr. Hali Marry and radiation oncologist - Dr. Drenda Freeze, Neurosurgeon Dr. Lacinda Axon. Started therapy on 07/07/2018 , she has been tolerating therapy well, except for this week, fist time that she has been acting tired. She is still having cognitive dysfunction , difficulty getting words out.   Depression: we started her on medication prior to diagnosis of brain tumor for cognitive dysfunction, phq 9 is understandably higher, we will continue medication   Patient Active Problem List   Diagnosis Date Noted  . Malabsorption 09/25/2017  . Low magnesium level 09/25/2017  . Low serum vitamin A 09/25/2017  . Elevated parathyroid hormone 08/31/2017  . Barrett esophagus 10/18/2014  . Morbid obesity ([place]) 10/18/2014  . Chronic eczema 10/18/2014  . GERD (gastroesophageal reflux disease) 10/18/2014  . Hypertension, benign 10/18/2014  . Vitamin D deficiency 10/18/2014  . Controlled gout 10/18/2014  . Hyperlipidemia 10/18/2014  . Venous insufficiency 10/18/2014  . OSA on CPAP 10/18/2014  . 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 History of  bariatric surgery 04/23/2011    Past Surgical History:  Procedure Laterality Date  . BUNIONECTOMY  2003  . CHOLECYSTECTOMY, LAPAROSCOPIC  01/31/2017  . LAPAROSCOPIC GASTRIC BAND REMOVAL WITH LAPAROSCOPIC GASTRIC SLEEVE RESECTION  04/23/2011  . LAPAROSCOPIC GASTRIC BANDING  2008  . LAPAROSCOPIC GASTRIC RESTRICTIVE DUODENAL PROCEDURE (DUODENAL SWITCH)  01/31/2017    Family History  Problem Relation Age of Onset  . Hypertension Mother   . Diabetes Mother   . Diabetes Brother   . Multiple sclerosis Brother     Social History   Socioeconomic History  . Marital status: Married    Spouse name: [name]  . Number of children: 2  . Years of education: Not on file  . Highest education level: Bachelor's degree (e.g., BA, AB, BS)  Occupational History  . Occupation: Retired  Scientific laboratory technician  . Financial resource strain: Not hard at all  . Food insecurity    Worry: Never true    Inability: Never true  . Transportation needs    Medical: No    Non-medical: No  Tobacco Use  . Smoking status: Former Smoker    Packs/day: 0.50    Years: 8.00    Pack years: 4.00    Types: Cigarettes    Start date: 01/23/1976    Quit date: 10/17/1984    Years since quitting: 33.7  . Smokeless tobacco: Never Used  . Tobacco comment: smoking cessation materials not required  Substance and Sexual Activity  . Alcohol use: Yes    Alcohol/week: 0.0 standard drinks    Comment: socially  . Drug use: No  . Sexual activity: Yes  Partners: Male    Birth control/protection: Post-menopausal  Lifestyle  . Physical activity    Days per week: 0 days    Minutes per session: 0 min  . Stress: Not at all  Relationships  . Social Herbalist on phone: Patient refused    Gets together: Patient refused    Attends religious service: Patient refused    Active member of club or organization: Patient refused    Attends meetings of clubs or organizations: Patient refused    Relationship status: Married  .  Intimate partner violence    Fear of current or ex partner: No    Emotionally abused: No    Physically abused: No    Forced sexual activity: No  Other Topics Concern  . Not on file  Social History Narrative  . Not on file     Current Outpatient Medications:  .  amLODipine (NORVASC) 5 MG tablet, Take 1 tablet (5 mg total) by mouth daily., Disp: 90 tablet, Rfl: 1 .  aspirin EC 81 MG tablet, Take 81 mg by mouth daily., Disp: , Rfl:  .  atorvastatin (LIPITOR) 20 MG tablet, Take 1 tablet (20 mg total) by mouth daily., Disp: 90 tablet, Rfl: 1 .  Calcium Carb-Cholecalciferol (CALCIUM-VITAMIN D) 500-200 MG-UNIT tablet, Take by mouth., Disp: , Rfl:  .  cholecalciferol (VITAMIN D) 1000 UNITS tablet, Take 1 tablet (1,000 Units total) by mouth daily., Disp: 30 tablet, Rfl: 0 .  escitalopram (LEXAPRO) 10 MG tablet, TAKE 1 TABLET BY MOUTH EVERY DAY, Disp: 30 tablet, Rfl: 0 .  febuxostat (ULORIC) 40 MG tablet, Take 1 tablet (40 mg total) by mouth daily., Disp: 90 tablet, Rfl: 1 .  Multiple Vitamin (MULTI-VITAMINS) TABS, Take by mouth., Disp: , Rfl:  .  NEXIUM 40 MG capsule, TAKE 1 CAPSULE BY MOUTH TWO TIMES DAILY, Disp: 180 capsule, Rfl: 3 .  spironolactone (ALDACTONE) 50 MG tablet, Take 1 tablet (50 mg total) by mouth daily., Disp: 90 tablet, Rfl: 1  Allergies  Allergen Reactions  . Pistachio Nut Extract Skin Test   . Allopurinol Other (See Comments)    rash    I personally reviewed active problem list, medication list, allergies, family history with the patient/caregiver today.   ROS  Ten systems reviewed and is negative except as mentioned in HPI   Objective  Virtual encounter, vitals not obtained.  There is no height or weight on file to calculate BMI.  Physical Exam  Awake, alert, laying in bed, cooperative, not sure about cognition , husband helped   PHQ2/9: Depression screen [facility] 2/9 07/15/2018 05/23/2018 05/13/2018 03/27/2018 11/26/2017  Decreased Interest 3 3 3  0 0  Down,  Depressed, Hopeless 0 0 2 0 0  PHQ - 2 Score 3 3 5  0 0  Altered sleeping 3 2 3  0 0  Tired, decreased energy 2 2 3  0 0  Change in appetite 3 2 0 0 0  Feeling bad or failure about yourself  0 0 0 0 0  Trouble concentrating 3 2 0 0 0  Moving slowly or fidgety/restless 3 3 0 0 0  Suicidal thoughts 0 0 0 0 0  PHQ-9 Score 17 14 11  0 0  Difficult doing work/chores Very difficult Extremely dIfficult Somewhat difficult Not difficult at all Not difficult at all   PHQ-2/9 Result is positive.    Fall Risk: Fall Risk  07/15/2018 05/26/2018 05/13/2018 03/27/2018 11/26/2017  Falls in the past year? 0 0 0 0 0  Number falls in past yr: 0 0 0 - 0  Comment - - - - -  Injury with Fall? 0 0 0 - 0  Comment - - - - -  Risk for fall due to : - - - - -  Risk for fall due to: Comment - - - - -  Follow up - - - - -     Assessment & Plan  1. Situational depression  - escitalopram (LEXAPRO) 10 MG tablet; Take 1 tablet (10 mg total) by mouth daily.  Dispense: 30 tablet; Refill: 1   2. 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 The patient was provided an opportunity to ask questions and all were answered. The patient agreed with the plan and demonstrated an understanding of the instructions.  The patient was advised to call back or seek an in-person evaluation if the symptoms worsen or if the condition fails to improve as anticipated.  I provided 25  minutes of non-face-to-face time during this encounter.

## 2018-08-05 ENCOUNTER — Other Ambulatory Visit: Payer: Self-pay | Admitting: Family Medicine

## 2018-08-05 ENCOUNTER — Ambulatory Visit: Payer: Self-pay

## 2018-08-05 DIAGNOSIS — F4321 Adjustment disorder with depressed mood: Secondary | ICD-10-CM

## 2018-08-05 NOTE — Telephone Encounter (Signed)
I spoke with the husband and per Dr Ancil Boozer, advised him to go to [facility] ER. Husband feels that he need to speak with a nurse or Dr Ancil Boozer.

## 2018-08-05 NOTE — Telephone Encounter (Signed)
Pt husband calling to report that his wife was regressing from the progress she has made after weeks after surgery for Glioblastoma. He states that he has discussed his wife issues with her surgeon and he suggest that she could have some infection causing her regression. Mr Kube states that she fell last week and has a bruise to her right knee. He notices that she has mild swelling to the right leg below the knee. She is having problems finding her words as she worked with speech therapy today. He states that the speech therapist was concerned. He states that his wife is weak and back to using her walker.  He has been checking her temperature for 2 days she has been 99, 101, and 98. He states that her urine is clear but she does have trouble starting her stream. No odor to the urine. Care advice read to Mr Josey. He verbalized understanding. Call placed to office.  Note will be routed for evaluation by Dr Ancil Boozer. Mr Garofano will wait for call from office.  Reason for Disposition . [1] Loss of speech or garbled speech AND [2] gradual onset (e.g., days to weeks) AND [3] present now  Answer Assessment - Initial Assessment Questions 1. ONSET: "When did the swelling start?" (e.g., minutes, hours, days)     2-3 day ago 2. LOCATION: "What part of the leg is swollen?"  "Are both legs swollen or just one leg?"     Rt leg below knee 3. SEVERITY: "How bad is the swelling?" (e.g., localized; mild, moderate, severe)  - Localized - small area of swelling localized to one leg  - MILD pedal edema - swelling limited to foot and ankle, pitting edema < 1/4 inch (6 mm) deep, rest and elevation eliminate most or all swelling  - MODERATE edema - swelling of lower leg to knee, pitting edema > 1/4 inch (6 mm) deep, rest and elevation only partially reduce swelling  - SEVERE edema - swelling extends above knee, facial or hand swelling present     mild 4. REDNESS: "Does the swelling look red or infected?"     None but  brising to knee area 5. PAIN: "Is the swelling painful to touch?" If so, ask: "How painful is it?"   (Scale 1-10; mild, moderate or severe)    0 6. FEVER: "Do you have a fever?" If so, ask: "What is it, how was it measured, and when did it start?"     98-101 7. CAUSE: "What do you think is causing the leg swelling?"    unsure 8. MEDICAL HISTORY: "Do you have a history of heart failure, kidney disease, liver failure, or cancer?"     cancer 9. RECURRENT SYMPTOM: "Have you had leg swelling before?" If so, ask: "When was the last time?" "What happened that time?"     unsure 10. OTHER SYMPTOMS: "Do you have any other symptoms?" (e.g., chest pain, difficulty breathing)       no 11. PREGNANCY: "Is there any chance you are pregnant?" "When was your last menstrual period?"       N/A  Answer Assessment - Initial Assessment Questions 1. SYMPTOM: "What is the main symptom you are concerned about?" (e.g., weakness, numbness)     weakness 2. ONSET: "When did this start?" (minutes, hours, days; while sleeping)     2-3 days ago 3. LAST NORMAL: "When was the last time you were normal (no symptoms)?"     Last progressing score high 4. PATTERN "Does this  come and go, or has it been constant since it started?"  "Is it present now?"    digression 5. CARDIAC SYMPTOMS: "Have you had any of the following symptoms: chest pain, difficulty breathing, palpitations?"     no 6. NEUROLOGIC SYMPTOMS: "Have you had any of the following symptoms: headache, dizziness, vision loss, double vision, changes in speech, unsteady on your feet?"    Unsteady on feet, speech is slow to collect the right words has come back 7. OTHER SYMPTOMS: "Do you have any other symptoms?"     Fever,  8. PREGNANCY: "Is there any chance you are pregnant?" "When was your last menstrual period?"     N/A  Protocols used: NEUROLOGIC DEFICIT-A-AH, LEG SWELLING AND EDEMA-A-AH

## 2018-08-18 ENCOUNTER — Telehealth: Payer: Self-pay

## 2018-08-18 NOTE — Telephone Encounter (Signed)
-----   Message from Vanetta Mulders, [place] sent at 04/09/2018  9:06 AM EDT ----- Regarding: Colonoscopy Canceled Per Dr. Vicente Males Patient returned our office call.  She has been advised that her colonoscopy with Dr. Vicente Males has been canceled due to the COVID 19.  We will contact her at a later time to reschedule.  Thanks  Driscilla Grammes CMA

## 2018-08-18 NOTE — Telephone Encounter (Signed)
Patients colonoscopy has not been scheduled.  Reviewed patients chart and since her colonoscopy cancellation due to COVID she has been diagnosed with brain tumor and just recently had an ER visit regarding the brain tumor.  Thanks [employer]

## 2018-08-26 ENCOUNTER — Other Ambulatory Visit: Payer: Self-pay | Admitting: Neurosurgery

## 2018-08-26 DIAGNOSIS — C719 Malignant neoplasm of brain, unspecified: Secondary | ICD-10-CM

## 2018-08-27 ENCOUNTER — Other Ambulatory Visit: Payer: Self-pay

## 2018-08-27 ENCOUNTER — Ambulatory Visit
Admission: RE | Admit: 2018-08-27 | Discharge: 2018-08-27 | Disposition: A | Discharge status: Home / Self Care | Payer: Medicare Other | Source: Ambulatory Visit | Attending: Neurosurgery | Admitting: Neurosurgery

## 2018-08-27 DIAGNOSIS — C719 Malignant neoplasm of brain, unspecified: Secondary | ICD-10-CM | POA: Diagnosis present

## 2018-08-27 LAB — POCT I-STAT CREATININE: Creatinine, Ser: 1.2 mg/dL — ABNORMAL HIGH (ref 0.44–1.00)

## 2018-08-27 MED ORDER — GADOBUTROL 1 MMOL/ML IV SOLN
10.0000 mL | Freq: Once | INTRAVENOUS | Status: AC | PRN
  Administered 2018-08-27: 10 mL via INTRAVENOUS

## 2018-08-28 DIAGNOSIS — G9389 Other specified disorders of brain: Secondary | ICD-10-CM | POA: Insufficient documentation

## 2018-09-02 ENCOUNTER — Other Ambulatory Visit: Payer: Self-pay | Admitting: Family Medicine

## 2018-09-02 ENCOUNTER — Telehealth: Payer: Self-pay | Admitting: Family Medicine

## 2018-09-02 DIAGNOSIS — F4321 Adjustment disorder with depressed mood: Secondary | ICD-10-CM

## 2018-09-02 NOTE — Telephone Encounter (Signed)
Caller name:Teagarden,John Relation to pt: spouse  Call back [place]: [place], [place] [phone] ([phone] (Fax)     Reason for call:  Spouse requesting NEXIUM 40 MG capsule brand only 90 day supply please send to mail order only.    Patient was discharged from [facility] today due infection corrected in her head, spouse wanted to make PCP aware.

## 2018-09-02 NOTE — Telephone Encounter (Signed)
Reason for call:  Spouse requesting NEXIUM 40 MG capsule brand only 90 day supply please send to mail order only.    Patient was discharged from [facility] today due infection corrected in her head, spouse wanted to make PCP aware.

## 2018-09-03 ENCOUNTER — Telehealth: Payer: Self-pay | Admitting: Family Medicine

## 2018-09-03 NOTE — Telephone Encounter (Signed)
Care Nurse notified

## 2018-09-03 NOTE — Telephone Encounter (Signed)
Caller: Mallie Mussel    Requesting: nursing (wound assessment, picc line care, dressing changes)   Frequency: Once a week for 35 days (end of cert period 7/90)   Call back number: [phone] VM ok

## 2018-09-10 DIAGNOSIS — Z9889 Other specified postprocedural states: Secondary | ICD-10-CM | POA: Insufficient documentation

## 2018-09-28 ENCOUNTER — Other Ambulatory Visit: Payer: Self-pay | Admitting: Family Medicine

## 2018-09-28 DIAGNOSIS — I10 Essential (primary) hypertension: Secondary | ICD-10-CM

## 2018-09-28 DIAGNOSIS — M109 Gout, unspecified: Secondary | ICD-10-CM

## 2018-10-01 ENCOUNTER — Other Ambulatory Visit: Payer: Self-pay | Admitting: Family Medicine

## 2018-10-01 DIAGNOSIS — E785 Hyperlipidemia, unspecified: Secondary | ICD-10-CM

## 2018-10-10 ENCOUNTER — Ambulatory Visit: Payer: Medicare Other

## 2018-10-10 ENCOUNTER — Encounter: Payer: Self-pay | Admitting: Family Medicine

## 2018-10-10 ENCOUNTER — Ambulatory Visit: Payer: Medicare Other | Admitting: Family Medicine

## 2018-10-10 ENCOUNTER — Other Ambulatory Visit: Payer: Self-pay

## 2018-10-10 VITALS — BP 122/80 | Temp 95.9°F | Wt 196.0 lb

## 2018-10-10 DIAGNOSIS — G4733 Obstructive sleep apnea (adult) (pediatric): Secondary | ICD-10-CM

## 2018-10-10 DIAGNOSIS — Z9884 Bariatric surgery status: Secondary | ICD-10-CM | POA: Diagnosis not present

## 2018-10-10 DIAGNOSIS — K2270 Barrett's esophagus without dysplasia: Secondary | ICD-10-CM

## 2018-10-10 DIAGNOSIS — E441 Mild protein-calorie malnutrition: Secondary | ICD-10-CM

## 2018-10-10 DIAGNOSIS — I10 Essential (primary) hypertension: Secondary | ICD-10-CM | POA: Diagnosis not present

## 2018-10-10 DIAGNOSIS — C711 Malignant neoplasm of frontal lobe: Secondary | ICD-10-CM | POA: Diagnosis not present

## 2018-10-10 DIAGNOSIS — F4321 Adjustment disorder with depressed mood: Secondary | ICD-10-CM

## 2018-10-10 DIAGNOSIS — E785 Hyperlipidemia, unspecified: Secondary | ICD-10-CM

## 2018-10-10 MED ORDER — ESCITALOPRAM OXALATE 10 MG PO TABS: 10.0000 mg | ORAL_TABLET | Freq: Every day | ORAL | 1 refills | Status: AC

## 2018-10-10 NOTE — Progress Notes (Signed)
Name: Shannon Petty   MRN: [id number]    DOB: 10-15-1950   Date:10/10/2018       Progress Note  Subjective  Chief Complaint  Chief Complaint  Patient presents with  . Medication Refill    6 month F/U Has lost 30 pounds since taking Chemo and surgeries   . Hypertension  . Depression  . Glioblastoma of frontal lobe (HCC)    HPI  Glioblastoma of Frontal Lobe: going to [facility], s/p resection , had wound infection and has been getting antibiotics through the pic line, today is the last day ( completing 6 weeks of therapy), she still has PT at home, able to walk around the house by herself, but when out her husband asks her to hold on to him. She still has difficulty with word fluency. She is getting ST, no dysarthria   Malnutrition: albumin went down to 2.9 , discussed adding protein to her diet   HTN:BP is at goal, no chest pain or palpitation.   Hyperlipidemia: shetaking Atorvastatin a few times a week,last LDL was  at goal,  No chest pain or palpitation  Gout: she states she has not have a gout attacks in years, taking Uloric no recent episodes   Obesity: she had gastric band in 2008 and sleeve in 2013 by Dr. Darnell Level. Maximum weight 275 lbs before 2nd surgery ,she had another procedure, duodenal switch on 01/31/2017 her weight was 259 lbs that day and today iswasdown to 223. Lbs and it was up to 229 lbs in March, however since chemo, surgery she has lost over 30 lbs, discussed increase protein intake   GERD/Barrett's esophagus:she ran out of Nexium in July and had severe pain and regurgitation.Seen by Dr. Durwin Reges and was given a refill of PPI, she states she usually takes once a day, but occasionally twice a day. Based on her records she is due for repeat colonoscopy, however having chemo for glioblastoma   OSA/CPAP: she has lost weight, also advised to stop CPAP by neurosurgeon because of pneumocephalus after craniotomy was done  Patient Active Problem List   Diagnosis Date  Noted  . 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 Glioblastoma ([place]) 06/13/2018  . Malabsorption 09/25/2017  . Low magnesium level 09/25/2017  . Low serum vitamin A 09/25/2017  . Elevated parathyroid hormone 08/31/2017  . Barrett esophagus 10/18/2014  . Morbid obesity ([place]) 10/18/2014  . Chronic eczema 10/18/2014  . GERD (gastroesophageal reflux disease) 10/18/2014  . Hypertension, benign 10/18/2014  . Vitamin D deficiency 10/18/2014  . Controlled gout 10/18/2014  . Hyperlipidemia 10/18/2014  . Venous insufficiency 10/18/2014  . OSA on CPAP 10/18/2014  . 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 History of bariatric surgery 04/23/2011    Past Surgical History:  Procedure Laterality Date  . 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 BUNIONECTOMY  2003  . CHOLECYSTECTOMY, LAPAROSCOPIC  01/31/2017  . LAPAROSCOPIC GASTRIC BAND REMOVAL WITH LAPAROSCOPIC GASTRIC SLEEVE RESECTION  04/23/2011  . LAPAROSCOPIC GASTRIC BANDING  2008  . LAPAROSCOPIC GASTRIC RESTRICTIVE DUODENAL PROCEDURE (DUODENAL SWITCH)  01/31/2017    Family History  Problem Relation Age of Onset  . Hypertension Mother   . Diabetes Mother   . Diabetes Brother   . Multiple sclerosis Brother     Social History   Socioeconomic History  . Marital status: Married    Spouse name: [name]  . Number of children: 2  . Years of education: Not on file  .  Highest education level: Bachelor's degree (e.g., BA, AB, BS)  Occupational History  . Occupation: Retired  Scientific laboratory technician  . Financial resource strain: Not hard at all  . Food insecurity    Worry: Never true    Inability: Never true  . Transportation needs    Medical: No    Non-medical: No  Tobacco Use  . Smoking status: Former Smoker    Packs/day: 0.50    Years: 8.00    Pack years: 4.00    Types: Cigarettes    Start date: 01/23/1976    Quit date: 10/17/1984    Years since quitting: 34.0  . Smokeless  tobacco: Never Used  . Tobacco comment: smoking cessation materials not required  Substance and Sexual Activity  . Alcohol use: Yes    Alcohol/week: 0.0 standard drinks    Comment: socially  . Drug use: No  . Sexual activity: Yes    Partners: Male    Birth control/protection: Post-menopausal  Lifestyle  . Physical activity    Days per week: 0 days    Minutes per session: 0 min  . Stress: Not at all  Relationships  . Social Herbalist on phone: Patient refused    Gets together: Patient refused    Attends religious service: Patient refused    Active member of club or organization: Patient refused    Attends meetings of clubs or organizations: Patient refused    Relationship status: Married  . Intimate partner violence    Fear of current or ex partner: No    Emotionally abused: No    Physically abused: No    Forced sexual activity: No  Other Topics Concern  . Not on file  Social History Narrative  . Not on file     Current Outpatient Medications:  .  amLODipine (NORVASC) 5 MG tablet, Take 1 tablet (5 mg total) by mouth daily., Disp: 90 tablet, Rfl: 1 .  atorvastatin (LIPITOR) 20 MG tablet, TAKE 1 TABLET BY MOUTH EVERY DAY, Disp: 90 tablet, Rfl: 1 .  Calcium Carb-Cholecalciferol (CALCIUM-VITAMIN D) 500-200 MG-UNIT tablet, Take by mouth., Disp: , Rfl:  .  cholecalciferol (VITAMIN D) 1000 UNITS tablet, Take 1 tablet (1,000 Units total) by mouth daily., Disp: 30 tablet, Rfl: 0 .  escitalopram (LEXAPRO) 10 MG tablet, TAKE 1 TABLET BY MOUTH  DAILY, Disp: 90 tablet, Rfl: 0 .  febuxostat (ULORIC) 40 MG tablet, TAKE 1 TABLET BY MOUTH EVERY DAY, Disp: 90 tablet, Rfl: 1 .  levETIRAcetam (KEPPRA) 500 MG tablet, Take 500 mg by mouth every 12 (twelve) hours., Disp: , Rfl:  .  Multiple Vitamin (MULTI-VITAMINS) TABS, Take by mouth., Disp: , Rfl:  .  NEXIUM 40 MG capsule, TAKE 1 CAPSULE BY MOUTH TWO TIMES DAILY, Disp: 180 capsule, Rfl: 3 .  prochlorperazine (COMPAZINE) 10 MG  tablet, , Disp: , Rfl:  .  spironolactone (ALDACTONE) 50 MG tablet, TAKE 1 TABLET BY MOUTH EVERY DAY, Disp: 90 tablet, Rfl: 1 .  temozolomide (TEMODAR) 100 MG capsule, Take 3 capsules (300mg ) nightly by mouth days 1-5. Off days 6-28., Disp: , Rfl:  .  aspirin EC 81 MG tablet, Take 81 mg by mouth daily., Disp: , Rfl:   Allergies  Allergen Reactions  . Omega 3  [Fish Oil] Other (See Comments)    Itchy throat  . Pistachio Nut Extract Skin Test   . Allopurinol Other (See Comments)    rash    I personally reviewed active problem list, medication list,  allergies, family history, social history, health maintenance with the patient/caregiver today.   ROS  Constitutional: Negative for fever positive for weight change.  Respiratory: Negative for cough and shortness of breath.   Cardiovascular: Negative for chest pain or palpitations.  Gastrointestinal: Negative for abdominal pain, no bowel changes.  Musculoskeletal: Positive for gait problem but no  joint swelling.  Skin: Negative for rash.  Neurological: Negative for dizziness or headache.  No other specific complaints in a complete review of systems (except as listed in HPI above).  Objective  Vitals:   10/10/18 1151  BP: 122/80  Temp: (!) 95.9 F (35.5 C)  TempSrc: Temporal  Weight: 196 lb (88.9 kg)    Body mass index is 32.62 kg/m.  Physical Exam  Constitutional: Patient appears well-developed and well-nourished. Obese No distress.  HEENT: head atraumatic, normocephalic, pupils equal and reactive to light Skin: pic line on right arm Cardiovascular: Normal rate, regular rhythm and normal heart sounds.  No murmur heard. No BLE edema. Pulmonary/Chest: Effort normal and breath sounds normal. No respiratory distress. Abdominal: Soft.  There is no tenderness. Scalp: deformed, part of the skull had to be removed after complications / infection from craniotomy  Psychiatric: Patient has a normal mood and affect. behavior is  normal. Judgment and thought content normal.  Recent Results (from the past 2160 hour(s))  I-STAT creatinine     Status: Abnormal   Collection Time: 08/27/18  9:17 AM  Result Value Ref Range   Creatinine, Ser 1.20 (H) 0.44 - 1.00 mg/dL     PHQ2/9: Depression screen [facility] 2/9 10/10/2018 07/15/2018 05/23/2018 05/13/2018 03/27/2018  Decreased Interest 0 3 3 3  0  Down, Depressed, Hopeless 0 0 0 2 0  PHQ - 2 Score 0 3 3 5  0  Altered sleeping 0 3 2 3  0  Tired, decreased energy 0 2 2 3  0  Change in appetite 0 3 2 0 0  Feeling bad or failure about yourself  0 0 0 0 0  Trouble concentrating 3 3 2  0 0  Moving slowly or fidgety/restless 0 3 3 0 0  Suicidal thoughts 0 0 0 0 0  PHQ-9 Score 3 17 14 11  0  Difficult doing work/chores Not difficult at all Very difficult Extremely dIfficult Somewhat difficult Not difficult at all  Some recent data might be hidden    phq 9 is negative   Fall Risk: Fall Risk  10/10/2018 07/15/2018 05/26/2018 05/13/2018 03/27/2018  Falls in the past year? 1 0 0 0 0  Number falls in past yr: 0 0 0 0 -  Comment - - - - -  Injury with Fall? 0 0 0 0 -  Comment - - - - -  Risk for fall due to : Impaired balance/gait - - - -  Risk for fall due to: Comment - - - - -  Follow up - - - - -    Functional Status Survey: Is the patient deaf or have difficulty hearing?: No Does the patient have difficulty seeing, even when wearing glasses/contacts?: Yes Does the patient have difficulty concentrating, remembering, or making decisions?: Yes Does the patient have difficulty walking or climbing stairs?: Yes Does the patient have difficulty dressing or bathing?: No Does the patient have difficulty doing errands alone such as visiting a doctor's office or shopping?: Yes    Assessment & Plan  1. Glioblastoma of frontal lobe ([place])  Still having chemotherapy but seems to be tolerating it well   2. Hypertension,  benign  At goal today   3. History of bariatric surgery   4. Mild  protein-calorie malnutrition ([place])  Discussed increasing protein intake  5. Dyslipidemia  On statin therapy   6. OSA (obstructive sleep apnea)  Not wearing CPAP because of pneumocephalus   7. Barrett's esophagus without dysplasia  Explained she should have refills given by Dr. Durwin Reges at [facility], they need to call the pharmacy   8. Situational depression  - escitalopram (LEXAPRO) 10 MG tablet; Take 1 tablet (10 mg total) by mouth daily.  Dispense: 90 tablet; Refill: 1

## 2018-10-17 ENCOUNTER — Ambulatory Visit (INDEPENDENT_AMBULATORY_CARE_PROVIDER_SITE_OTHER): Payer: Medicare Other

## 2018-10-17 VITALS — Ht 65.0 in | Wt 196.0 lb

## 2018-10-17 DIAGNOSIS — Z0000 Encounter for general adult medical examination without abnormal findings: Secondary | ICD-10-CM | POA: Diagnosis not present

## 2018-10-17 NOTE — Patient Instructions (Signed)
Shannon Petty , Thank you for taking time to come for your Medicare Wellness Visit. I appreciate your ongoing commitment to your health goals. Please review the following plan we discussed and let me know if I can assist you in the future.   Screening recommendations/referrals: Colonoscopy: done 02/25/14 Mammogram: done 11/09/15 Bone Density: done 12/30/14 Recommended yearly ophthalmology/optometry visit for glaucoma screening and checkup Recommended yearly dental visit for hygiene and checkup  Vaccinations: Influenza vaccine: done 10/09/18 Pneumococcal vaccine: done 10/19/15 Tdap vaccine: done 12/12/16 Shingles vaccine: Shingrix discussed. Please contact your pharmacy for coverage information.     Advanced directives: Please bring a copy of your health care power of attorney and living will to the office at your convenience.  Conditions/risks identified: recommend preventing falls  Next appointment: Please follow up in one year for your Medicare Annual Wellness visit.     Preventive Care 69 Years and Older, Female Preventive care refers to lifestyle choices and visits with your health care provider that can promote health and wellness. What does preventive care include?  A yearly physical exam. This is also called an annual well check.  Dental exams once or twice a year.  Routine eye exams. Ask your health care provider how often you should have your eyes checked.  Personal lifestyle choices, including:  Daily care of your teeth and gums.  Regular physical activity.  Eating a healthy diet.  Avoiding tobacco and drug use.  Limiting alcohol use.  Practicing safe sex.  Taking low-dose aspirin every day.  Taking vitamin and mineral supplements as recommended by your health care provider. What happens during an annual well check? The services and screenings done by your health care provider during your annual well check will depend on your age, overall health, lifestyle risk  factors, and family history of disease. Counseling  Your health care provider may ask you questions about your:  Alcohol use.  Tobacco use.  Drug use.  Emotional well-being.  Home and relationship well-being.  Sexual activity.  Eating habits.  History of falls.  Memory and ability to understand (cognition).  Work and work Statistician.  Reproductive health. Screening  You may have the following tests or measurements:  Height, weight, and BMI.  Blood pressure.  Lipid and cholesterol levels. These may be checked every 5 years, or more frequently if you are over 88 years old.  Skin check.  Lung cancer screening. You may have this screening every year starting at age 69 if you have a 30-pack-year history of smoking and currently smoke or have quit within the past 15 years.  Fecal occult blood test (FOBT) of the stool. You may have this test every year starting at age 69.  Flexible sigmoidoscopy or colonoscopy. You may have a sigmoidoscopy every 5 years or a colonoscopy every 10 years starting at age 69.  Hepatitis C blood test.  Hepatitis B blood test.  Sexually transmitted disease (STD) testing.  Diabetes screening. This is done by checking your blood sugar (glucose) after you have not eaten for a while (fasting). You may have this done every 1-3 years.  Bone density scan. This is done to screen for osteoporosis. You may have this done starting at age 69.  Mammogram. This may be done every 1-2 years. Talk to your health care provider about how often you should have regular mammograms. Talk with your health care provider about your test results, treatment options, and if necessary, the need for more tests. Vaccines  Your health care provider  may recommend certain vaccines, such as:  Influenza vaccine. This is recommended every year.  Tetanus, diphtheria, and acellular pertussis (Tdap, Td) vaccine. You may need a Td booster every 10 years.  Zoster vaccine. You  may need this after age 69.  Pneumococcal 13-valent conjugate (PCV13) vaccine. One dose is recommended after age 69.  Pneumococcal polysaccharide (PPSV23) vaccine. One dose is recommended after age 69. Talk to your health care provider about which screenings and vaccines you need and how often you need them. This information is not intended to replace advice given to you by your health care provider. Make sure you discuss any questions you have with your health care provider. Document Released: 02/04/2015 Document Revised: 09/28/2015 Document Reviewed: 11/09/2014 Elsevier Interactive Patient Education  2017 [employer] Prevention in the Home Falls can cause injuries. They can happen to people of all ages. There are many things you can do to make your home safe and to help prevent falls. What can I do on the outside of my home?  Regularly fix the edges of walkways and driveways and fix any cracks.  Remove anything that might make you trip as you walk through a door, such as a raised step or threshold.  Trim any bushes or trees on the path to your home.  Use bright outdoor lighting.  Clear any walking paths of anything that might make someone trip, such as rocks or tools.  Regularly check to see if handrails are loose or broken. Make sure that both sides of any steps have handrails.  Any raised decks and porches should have guardrails on the edges.  Have any leaves, snow, or ice cleared regularly.  Use sand or salt on walking paths during winter.  Clean up any spills in your garage right away. This includes oil or grease spills. What can I do in the bathroom?  Use night lights.  Install grab bars by the toilet and in the tub and shower. Do not use towel bars as grab bars.  Use non-skid mats or decals in the tub or shower.  If you need to sit down in the shower, use a plastic, non-slip stool.  Keep the floor dry. Clean up any water that spills on the floor as soon as  it happens.  Remove soap buildup in the tub or shower regularly.  Attach bath mats securely with double-sided non-slip rug tape.  Do not have throw rugs and other things on the floor that can make you trip. What can I do in the bedroom?  Use night lights.  Make sure that you have a light by your bed that is easy to reach.  Do not use any sheets or blankets that are too big for your bed. They should not hang down onto the floor.  Have a firm chair that has side arms. You can use this for support while you get dressed.  Do not have throw rugs and other things on the floor that can make you trip. What can I do in the kitchen?  Clean up any spills right away.  Avoid walking on wet floors.  Keep items that you use a lot in easy-to-reach places.  If you need to reach something above you, use a strong step stool that has a grab bar.  Keep electrical cords out of the way.  Do not use floor polish or wax that makes floors slippery. If you must use wax, use non-skid floor wax.  Do not have throw rugs  and other things on the floor that can make you trip. What can I do with my stairs?  Do not leave any items on the stairs.  Make sure that there are handrails on both sides of the stairs and use them. Fix handrails that are broken or loose. Make sure that handrails are as long as the stairways.  Check any carpeting to make sure that it is firmly attached to the stairs. Fix any carpet that is loose or worn.  Avoid having throw rugs at the top or bottom of the stairs. If you do have throw rugs, attach them to the floor with carpet tape.  Make sure that you have a light switch at the top of the stairs and the bottom of the stairs. If you do not have them, ask someone to add them for you. What else can I do to help prevent falls?  Wear shoes that:  Do not have high heels.  Have rubber bottoms.  Are comfortable and fit you well.  Are closed at the toe. Do not wear sandals.  If  you use a stepladder:  Make sure that it is fully opened. Do not climb a closed stepladder.  Make sure that both sides of the stepladder are locked into place.  Ask someone to hold it for you, if possible.  Clearly mark and make sure that you can see:  Any grab bars or handrails.  First and last steps.  Where the edge of each step is.  Use tools that help you move around (mobility aids) if they are needed. These include:  Canes.  Walkers.  Scooters.  Crutches.  Turn on the lights when you go into a dark area. Replace any light bulbs as soon as they burn out.  Set up your furniture so you have a clear path. Avoid moving your furniture around.  If any of your floors are uneven, fix them.  If there are any pets around you, be aware of where they are.  Review your medicines with your doctor. Some medicines can make you feel dizzy. This can increase your chance of falling. Ask your doctor what other things that you can do to help prevent falls. This information is not intended to replace advice given to you by your health care provider. Make sure you discuss any questions you have with your health care provider. Document Released: 11/04/2008 Document Revised: 06/16/2015 Document Reviewed: 02/12/2014 Elsevier Interactive Patient Education  2017 [employer].

## 2018-10-17 NOTE — Progress Notes (Signed)
Subjective:   Shannon Petty is a 68 y.o. female who presents for Medicare Annual (Subsequent) preventive examination.  Virtual Visit via Telephone Note  I connected with Shannon Petty on 10/17/18 at 11:20 AM EDT by telephone and verified that I am speaking with the correct person using two identifiers.  Medicare Annual Wellness visit completed telephonically due to Covid-19 pandemic.   Location: Patient: home Provider: office   I discussed the limitations, risks, security and privacy concerns of performing an evaluation and management service by telephone and the availability of in person appointments. The patient expressed understanding and agreed to proceed.  Some vital signs may be absent or patient reported.   Clemetine Marker, LPN    Review of Systems:   Cardiac Risk Factors include: advanced age (>62men, >71 women);dyslipidemia;hypertension;obesity (BMI >30kg/m2)     Objective:     Vitals: Ht 5\' 5"  (1.651 m)   Wt 196 lb (88.9 kg)   BMI 32.62 kg/m   Body mass index is 32.62 kg/m.  Advanced Directives 10/17/2018 10/08/2017 10/01/2016 05/29/2016 10/19/2015 07/14/2015 04/08/2015  Does Patient Have a Medical Advance Directive? Yes Yes Yes Yes Yes Yes Yes  Type of Paramedic of [place];Living will [place];Living will [place];Living will [place];Living will [place];Living will [place];Living will [place];Living will  Does patient want to make changes to medical advance directive? No - Patient declined - - - No - Patient declined No - Patient declined -  Copy of [place] in Chart? No - copy requested No - copy requested No - copy requested - No - copy requested No - copy requested Yes    Tobacco Social History   Tobacco Use  Smoking Status Former Smoker  . Packs/day: 0.50  . Years: 8.00  . Pack years: 4.00   . Types: Cigarettes  . Start date: 01/23/1976  . Quit date: 10/17/1984  . Years since quitting: 34.0  Smokeless Tobacco Never Used  Tobacco Comment   smoking cessation materials not required     Counseling given: Not Answered Comment: smoking cessation materials not required   Clinical Intake:  Pre-visit preparation completed: Yes  Pain : No/denies pain     BMI - recorded: 32.62 Nutritional Status: BMI > 30  Obese Nutritional Risks: Nausea/ vomitting/ diarrhea(diarrhea, side effect from chemo) Diabetes: No  How often do you need to have someone help you when you read instructions, pamphlets, or other written materials from your doctor or pharmacy?: 1 - Never  Interpreter Needed?: No  Information entered by :: Clemetine Marker LPN  Past Medical History:  Diagnosis Date  . Benign paroxysmal positional vertigo    unspecified laterality  . Controlled gout   . Depression   . Eczema   . Gallstones 01/11/2017  . History of laparoscopic adjustable gastric banding   . Hyperlipidemia   . Hypertension   . Obesity   . OSA (obstructive sleep apnea)   . Venous insufficiency of both lower extremities   . Vitamin D deficiency    Past Surgical History:  Procedure Laterality Date  . 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 BUNIONECTOMY  2003  . CHOLECYSTECTOMY, LAPAROSCOPIC  01/31/2017  . LAPAROSCOPIC GASTRIC BAND REMOVAL WITH LAPAROSCOPIC GASTRIC SLEEVE RESECTION  04/23/2011  . LAPAROSCOPIC GASTRIC BANDING  2008  . LAPAROSCOPIC GASTRIC RESTRICTIVE DUODENAL PROCEDURE (DUODENAL SWITCH)  01/31/2017   Family History  Problem Relation Age of  Onset  . Hypertension Mother   . Diabetes Mother   . Diabetes Brother   . Multiple sclerosis Brother    Social History   Socioeconomic History  . Marital status: Married    Spouse name: [name]  . Number of children: 2  . Years of education: Not on file  . Highest education level: Bachelor's degree (e.g., BA, AB, BS)  Occupational History  .  Occupation: Retired  Scientific laboratory technician  . Financial resource strain: Not hard at all  . Food insecurity    Worry: Never true    Inability: Never true  . Transportation needs    Medical: No    Non-medical: No  Tobacco Use  . Smoking status: Former Smoker    Packs/day: 0.50    Years: 8.00    Pack years: 4.00    Types: Cigarettes    Start date: 01/23/1976    Quit date: 10/17/1984    Years since quitting: 34.0  . Smokeless tobacco: Never Used  . Tobacco comment: smoking cessation materials not required  Substance and Sexual Activity  . Alcohol use: Not Currently    Alcohol/week: 0.0 standard drinks  . Drug use: No  . Sexual activity: Yes    Partners: Male    Birth control/protection: Post-menopausal  Lifestyle  . Physical activity    Days per week: 0 days    Minutes per session: 0 min  . Stress: Not at all  Relationships  . Social Herbalist on phone: Patient refused    Gets together: Patient refused    Attends religious service: Patient refused    Active member of club or organization: Patient refused    Attends meetings of clubs or organizations: Patient refused    Relationship status: Married  Other Topics Concern  . Not on file  Social History Narrative  . Not on file    Outpatient Encounter Medications as of 10/17/2018  Medication Sig  . amLODipine (NORVASC) 5 MG tablet Take 1 tablet (5 mg total) by mouth daily.  Marland Kitchen aspirin EC 81 MG tablet Take 81 mg by mouth daily.  Marland Kitchen atorvastatin (LIPITOR) 20 MG tablet TAKE 1 TABLET BY MOUTH EVERY DAY  . Calcium Carb-Cholecalciferol (CALCIUM-VITAMIN D) 500-200 MG-UNIT tablet Take by mouth.  . cholecalciferol (VITAMIN D) 1000 UNITS tablet Take 1 tablet (1,000 Units total) by mouth daily.  Marland Kitchen escitalopram (LEXAPRO) 10 MG tablet Take 1 tablet (10 mg total) by mouth daily.  . febuxostat (ULORIC) 40 MG tablet TAKE 1 TABLET BY MOUTH EVERY DAY  . levETIRAcetam (KEPPRA) 500 MG tablet Take 500 mg by mouth every 12 (twelve) hours.  .  Multiple Vitamin (MULTI-VITAMINS) TABS Take by mouth.  Marland Kitchen NEXIUM 40 MG capsule TAKE 1 CAPSULE BY MOUTH TWO TIMES DAILY  . prochlorperazine (COMPAZINE) 10 MG tablet   . spironolactone (ALDACTONE) 50 MG tablet TAKE 1 TABLET BY MOUTH EVERY DAY  . temozolomide (TEMODAR) 100 MG capsule Take 3 capsules (300mg ) nightly by mouth days 1-5. Off days 6-28.   No facility-administered encounter medications on file as of 10/17/2018.     Activities of Daily Living In your present state of health, do you have any difficulty performing the following activities: 10/17/2018 10/10/2018  Hearing? N N  Comment declines hearing aids -  Vision? N Y  Difficulty concentrating or making decisions? N Y  Walking or climbing stairs? N Y  Dressing or bathing? N N  Doing errands, shopping? N Y  Conservation officer, nature and eating ?  N -  Using the Toilet? N -  In the past six months, have you accidently leaked urine? N -  Do you have problems with loss of bowel control? N -  Managing your Medications? N -  Managing your Finances? N -  Housekeeping or managing your Housekeeping? N -  Some recent data might be hidden    Patient Care Team: Steele Sizer, MD as PCP - General (Family Medicine) Bonner Puna, MD as Consulting Physician (Specialist) Pa, [place] as Consulting Physician (Optometry) Lucilla Lame, MD as Consulting Physician (Gastroenterology)    Assessment:   This is a routine wellness examination for [place].  Exercise Activities and Dietary recommendations Current Exercise Habits: The patient does not participate in regular exercise at present, Exercise limited by: Other - see comments  Goals    . Increase water intake     Recommend increasing water intake to 4-6 glasses a day.     . Prevent falls     Recommend to remove any items from the home that may cause slips or trips.       Fall Risk Fall Risk  10/17/2018 10/10/2018 07/15/2018 05/26/2018 05/13/2018  Falls in the past year? 1 1 0 0 0   Number falls in past yr: 1 0 0 0 0  Comment - - - - -  Injury with Fall? 0 0 0 0 0  Comment - - - - -  Risk for fall due to : History of fall(s);Impaired balance/gait Impaired balance/gait - - -  Risk for fall due to: Comment - - - - -  Follow up Falls prevention discussed - - - -   FALL RISK PREVENTION PERTAINING TO THE HOME:  Any stairs in or around the home? Yes  If so, do they handrails? No  - 2 steps only  Home free of loose throw rugs in walkways, pet beds, electrical cords, etc? Yes  Adequate lighting in your home to reduce risk of falls? Yes   ASSISTIVE DEVICES UTILIZED TO PREVENT FALLS:  Life alert? No  Use of a cane, walker or w/c? No  Grab bars in the bathroom? Yes  Shower chair or bench in shower? Yes  Elevated toilet seat or a handicapped toilet? Yes   DME ORDERS:  DME order needed?  No   TIMED UP AND GO:  Was the test performed? No . Telephonic visit  Education: Fall risk prevention has been discussed.  Intervention(s) required? No   Depression Screen PHQ 2/9 Scores 10/17/2018 10/10/2018 07/15/2018 05/23/2018  PHQ - 2 Score 0 0 3 3  PHQ- 9 Score - 3 17 14      Cognitive Function pt declines 6CIT for 2020 AWV     6CIT Screen 10/08/2017 10/01/2016  What Year? 0 points 0 points  What month? 0 points 0 points  What time? 0 points 0 points  Count back from 20 0 points 0 points  Months in reverse 0 points 0 points  Repeat phrase 0 points 0 points  Total Score 0 0    Immunization History  Administered Date(s) Administered  . Influenza, High Dose Seasonal PF 10/18/2014, 10/01/2016, 10/08/2017, 10/09/2018  . Influenza,inj,Quad PF,6+ Mos 10/19/2015  . Influenza-Unspecified 09/08/2013, 10/09/2018  . Pneumococcal Conjugate-13 10/19/2015  . Pneumococcal Polysaccharide-23 10/18/2014  . Tdap 10/30/2006, 12/12/2016  . Zoster 01/22/2010    Qualifies for Shingles Vaccine? Yes  Zostavax completed 2012. Due for Shingrix. Education has been provided regarding the  importance of this vaccine. Pt has  been advised to call insurance company to determine out of pocket expense. Advised may also receive vaccine at local pharmacy or Health Dept. Verbalized acceptance and understanding.  Tdap: Up to date  Flu Vaccine: Up to date  Pneumococcal Vaccine: Up to date   Screening Tests Health Maintenance  Topic Date Due  . MAMMOGRAM  11/08/2016  . COLONOSCOPY  02/25/2017  . TETANUS/TDAP  12/13/2026  . INFLUENZA VACCINE  Completed  . DEXA SCAN  Completed  . Hepatitis C Screening  Completed  . PNA vac Low Risk Adult  Completed    Cancer Screenings:  Colorectal Screening: Completed 02/25/14. Repeat every 3 years; declines repeat screening at this time   Mammogram: Completed 11/09/15. Declines repeat screening at this time.   Bone Density: Completed 12/30/14. Results reflect NORMAL. Declines repeat screening at this time.   Lung Cancer Screening: (Low Dose CT Chest recommended if Age 62-80 years, 30 pack-year currently smoking OR have quit w/in 15years.) does not qualify.   Additional Screening:  Hepatitis C Screening: does qualify; Completed 08/13/13  Vision Screening: Recommended annual ophthalmology exams for early detection of glaucoma and other disorders of the eye. Is the patient up to date with their annual eye exam?  Yes  Who is the provider or what is the name of the office in which the pt attends annual eye exams? [place] Screening: Recommended annual dental exams for proper oral hygiene  Community Resource Referral:  CRR required this visit?  No      Plan:    I have personally reviewed and addressed the Medicare Annual Wellness questionnaire and have noted the following in the patient's chart:  A. Medical and social history B. Use of alcohol, tobacco or illicit drugs  C. Current medications and supplements D. Functional ability and status E.  Nutritional status F.  Physical activity G. Advance directives H. List  of other physicians I.  Hospitalizations, surgeries, and ER visits in previous 12 months J.  [place] such as hearing and vision if needed, cognitive and depression L. Referrals and appointments   In addition, I have reviewed and discussed with patient certain preventive protocols, quality metrics, and best practice recommendations. A written personalized care plan for preventive services as well as general preventive health recommendations were provided to patient.   Signed,  Clemetine Marker, LPN Nurse Health Advisor   Nurse Notes: none

## 2018-12-04 ENCOUNTER — Other Ambulatory Visit: Payer: Self-pay | Admitting: Family Medicine

## 2018-12-04 DIAGNOSIS — E2839 Other primary ovarian failure: Secondary | ICD-10-CM

## 2018-12-04 DIAGNOSIS — Z1239 Encounter for other screening for malignant neoplasm of breast: Secondary | ICD-10-CM

## 2019-03-02 ENCOUNTER — Ambulatory Visit: Payer: Medicare Other | Attending: Internal Medicine

## 2019-03-02 ENCOUNTER — Other Ambulatory Visit: Payer: Self-pay

## 2019-03-02 DIAGNOSIS — Z23 Encounter for immunization: Secondary | ICD-10-CM | POA: Insufficient documentation

## 2019-03-02 NOTE — Progress Notes (Signed)
   Covid-19 Vaccination Clinic  Name:  Shannon Petty    MRN: [id number] DOB: 01-08-1951  03/02/2019  [name] was observed post Covid-19 immunization for 15 minutes without incidence. She was provided with Vaccine Information Sheet and instruction to access the V-Safe system.   [name] was instructed to call 911 with any severe reactions post vaccine: Marland Kitchen Difficulty breathing  . Swelling of your face and throat  . A fast heartbeat  . A bad rash all over your body  . Dizziness and weakness    Immunizations Administered    Name Date Dose VIS Date Route   Moderna COVID-19 Vaccine 03/02/2019 10:54 AM 0.5 mL 12/23/2018 Intramuscular   Manufacturer: Moderna   Lot: [id number]   [id number]

## 2019-03-25 ENCOUNTER — Other Ambulatory Visit: Payer: Self-pay | Admitting: Family Medicine

## 2019-03-25 DIAGNOSIS — E785 Hyperlipidemia, unspecified: Secondary | ICD-10-CM

## 2019-03-25 DIAGNOSIS — M109 Gout, unspecified: Secondary | ICD-10-CM

## 2019-03-25 DIAGNOSIS — I10 Essential (primary) hypertension: Secondary | ICD-10-CM

## 2019-04-01 ENCOUNTER — Ambulatory Visit: Payer: Medicare Other | Attending: Internal Medicine

## 2019-04-01 DIAGNOSIS — Z23 Encounter for immunization: Secondary | ICD-10-CM | POA: Insufficient documentation

## 2019-04-01 NOTE — Progress Notes (Signed)
   Covid-19 Vaccination Clinic  Name:  Shannon Petty    MRN: [id number] DOB: [birth year]  04/01/2019  Shannon Petty was observed post Covid-19 immunization for 15 minutes without incident. She was provided with Vaccine Information Sheet and instruction to access the V-Safe system.   Shannon Petty was instructed to call 911 with any severe reactions post vaccine: Marland Kitchen Difficulty breathing  . Swelling of face and throat  . A fast heartbeat  . A bad rash all over body  . Dizziness and weakness   Immunizations Administered    Name Date Dose VIS Date Route   Moderna COVID-19 Vaccine 04/01/2019 10:04 AM 0.5 mL 12/23/2018 Intramuscular   Manufacturer: Moderna   Lot: [id number]   [place]:[id number]

## 2019-04-07 ENCOUNTER — Emergency Department
Admission: EM | Admit: 2019-04-07 | Discharge: 2019-04-07 | Disposition: A | Discharge status: Other Acute Inpatient Hospital | Payer: Medicare Other | Attending: Emergency Medicine | Admitting: Emergency Medicine

## 2019-04-07 ENCOUNTER — Encounter: Payer: Self-pay | Admitting: Emergency Medicine

## 2019-04-07 ENCOUNTER — Emergency Department: Payer: Medicare Other

## 2019-04-07 ENCOUNTER — Other Ambulatory Visit: Payer: Self-pay

## 2019-04-07 DIAGNOSIS — I10 Essential (primary) hypertension: Secondary | ICD-10-CM | POA: Insufficient documentation

## 2019-04-07 DIAGNOSIS — R82998 Other abnormal findings in urine: Secondary | ICD-10-CM | POA: Insufficient documentation

## 2019-04-07 DIAGNOSIS — Z20822 Contact with and (suspected) exposure to covid-19: Secondary | ICD-10-CM | POA: Insufficient documentation

## 2019-04-07 DIAGNOSIS — G936 Cerebral edema: Secondary | ICD-10-CM | POA: Diagnosis not present

## 2019-04-07 DIAGNOSIS — Z79899 Other long term (current) drug therapy: Secondary | ICD-10-CM | POA: Insufficient documentation

## 2019-04-07 DIAGNOSIS — E875 Hyperkalemia: Secondary | ICD-10-CM | POA: Diagnosis not present

## 2019-04-07 DIAGNOSIS — R7401 Elevation of levels of liver transaminase levels: Secondary | ICD-10-CM | POA: Insufficient documentation

## 2019-04-07 DIAGNOSIS — I872 Venous insufficiency (chronic) (peripheral): Secondary | ICD-10-CM | POA: Diagnosis not present

## 2019-04-07 DIAGNOSIS — R0603 Acute respiratory distress: Secondary | ICD-10-CM | POA: Diagnosis not present

## 2019-04-07 DIAGNOSIS — T68XXXA Hypothermia, initial encounter: Secondary | ICD-10-CM | POA: Diagnosis not present

## 2019-04-07 DIAGNOSIS — C719 Malignant neoplasm of brain, unspecified: Secondary | ICD-10-CM | POA: Insufficient documentation

## 2019-04-07 DIAGNOSIS — Z87891 Personal history of nicotine dependence: Secondary | ICD-10-CM | POA: Diagnosis not present

## 2019-04-07 DIAGNOSIS — G40901 Epilepsy, unspecified, not intractable, with status epilepticus: Secondary | ICD-10-CM | POA: Diagnosis not present

## 2019-04-07 DIAGNOSIS — X31XXXA Exposure to excessive natural cold, initial encounter: Secondary | ICD-10-CM | POA: Diagnosis not present

## 2019-04-07 DIAGNOSIS — R7989 Other specified abnormal findings of blood chemistry: Secondary | ICD-10-CM

## 2019-04-07 DIAGNOSIS — R569 Unspecified convulsions: Secondary | ICD-10-CM | POA: Diagnosis present

## 2019-04-07 DIAGNOSIS — R778 Other specified abnormalities of plasma proteins: Secondary | ICD-10-CM

## 2019-04-07 HISTORY — DX: Malignant neoplasm of brain, unspecified: C71.9

## 2019-04-07 LAB — PROTIME-INR
INR: 1 (ref 0.8–1.2)
Prothrombin Time: 12.6 seconds (ref 11.4–15.2)

## 2019-04-07 LAB — URINE DRUG SCREEN, QUALITATIVE (ARMC ONLY)
Amphetamines, Ur Screen: NOT DETECTED
Barbiturates, Ur Screen: NOT DETECTED
Benzodiazepine, Ur Scrn: NOT DETECTED
Cannabinoid 50 Ng, Ur ~~LOC~~: NOT DETECTED
Cocaine Metabolite,Ur ~~LOC~~: NOT DETECTED
MDMA (Ecstasy)Ur Screen: NOT DETECTED
Methadone Scn, Ur: NOT DETECTED
Opiate, Ur Screen: NOT DETECTED
Phencyclidine (PCP) Ur S: NOT DETECTED
Tricyclic, Ur Screen: NOT DETECTED

## 2019-04-07 LAB — BLOOD GAS, ARTERIAL
Acid-base deficit: 4.5 mmol/L — ABNORMAL HIGH (ref 0.0–2.0)
Bicarbonate: 21.1 mmol/L (ref 20.0–28.0)
FIO2: 1
MECHVT: 450 mL
Mechanical Rate: 20
O2 Saturation: 100 %
PEEP: 5 cmH2O
Patient temperature: 37
pCO2 arterial: 40 mmHg (ref 32.0–48.0)
pH, Arterial: 7.33 — ABNORMAL LOW (ref 7.350–7.450)
pO2, Arterial: 516 mmHg — ABNORMAL HIGH (ref 83.0–108.0)

## 2019-04-07 LAB — URINALYSIS, COMPLETE (UACMP) WITH MICROSCOPIC
Bilirubin Urine: NEGATIVE
Glucose, UA: NEGATIVE mg/dL
Hgb urine dipstick: NEGATIVE
Ketones, ur: NEGATIVE mg/dL
Nitrite: NEGATIVE
Protein, ur: NEGATIVE mg/dL
Specific Gravity, Urine: 1.021 (ref 1.005–1.030)
pH: 5 (ref 5.0–8.0)

## 2019-04-07 LAB — COMPREHENSIVE METABOLIC PANEL
ALT: 63 U/L — ABNORMAL HIGH (ref 0–44)
AST: 58 U/L — ABNORMAL HIGH (ref 15–41)
Albumin: 3.5 g/dL (ref 3.5–5.0)
Alkaline Phosphatase: 157 U/L — ABNORMAL HIGH (ref 38–126)
Anion gap: 13 (ref 5–15)
BUN: 37 mg/dL — ABNORMAL HIGH (ref 8–23)
CO2: 20 mmol/L — ABNORMAL LOW (ref 22–32)
Calcium: 9 mg/dL (ref 8.9–10.3)
Chloride: 104 mmol/L (ref 98–111)
Creatinine, Ser: 0.83 mg/dL (ref 0.44–1.00)
GFR calc Af Amer: >60 mL/min (ref >60)
GFR calc non Af Amer: >60 mL/min (ref >60)
Glucose, Bld: 200 mg/dL — ABNORMAL HIGH (ref 70–99)
Potassium: 5.6 mmol/L — ABNORMAL HIGH (ref 3.5–5.1)
Sodium: 137 mmol/L (ref 135–145)
Total Bilirubin: 0.7 mg/dL (ref 0.3–1.2)
Total Protein: 6.9 g/dL (ref 6.5–8.1)

## 2019-04-07 LAB — CBC WITH DIFFERENTIAL/PLATELET
Abs Immature Granulocytes: 0.53 10*3/uL — ABNORMAL HIGH (ref 0.00–0.07)
Basophils Absolute: 0 10*3/uL (ref 0.0–0.1)
Basophils Relative: 0 %
Eosinophils Absolute: 0 10*3/uL (ref 0.0–0.5)
Eosinophils Relative: 0 %
HCT: 46.3 % — ABNORMAL HIGH (ref 36.0–46.0)
Hemoglobin: 15.9 g/dL — ABNORMAL HIGH (ref 12.0–15.0)
Immature Granulocytes: 3 %
Lymphocytes Relative: 9 %
Lymphs Abs: 1.9 10*3/uL (ref 0.7–4.0)
MCH: 33.5 pg (ref 26.0–34.0)
MCHC: 34.3 g/dL (ref 30.0–36.0)
MCV: 97.7 fL (ref 80.0–100.0)
Monocytes Absolute: 1 10*3/uL (ref 0.1–1.0)
Monocytes Relative: 5 %
Neutro Abs: 17.4 10*3/uL — ABNORMAL HIGH (ref 1.7–7.7)
Neutrophils Relative %: 83 %
Platelets: 309 10*3/uL (ref 150–400)
RBC: 4.74 MIL/uL (ref 3.87–5.11)
RDW: 13.6 % (ref 11.5–15.5)
WBC: 20.7 10*3/uL — ABNORMAL HIGH (ref 4.0–10.5)
nRBC: 0 % (ref 0.0–0.2)

## 2019-04-07 LAB — MAGNESIUM: Magnesium: 2.4 mg/dL (ref 1.7–2.4)

## 2019-04-07 LAB — APTT: aPTT: 25 seconds (ref 24–36)

## 2019-04-07 LAB — RESPIRATORY PANEL BY RT PCR (FLU A&B, COVID)
Influenza A by PCR: NEGATIVE
Influenza B by PCR: NEGATIVE
SARS Coronavirus 2 by RT PCR: NEGATIVE

## 2019-04-07 LAB — ETHANOL: Alcohol, Ethyl (B): <10 mg/dL (ref <10)

## 2019-04-07 LAB — LACTIC ACID, PLASMA
Lactic Acid, Venous: 7 mmol/L (ref 0.5–1.9)
Lactic Acid, Venous: 2 mmol/L (ref 0.5–1.9)

## 2019-04-07 LAB — TROPONIN I (HIGH SENSITIVITY): Troponin I (High Sensitivity): 21 ng/L — ABNORMAL HIGH (ref <18)

## 2019-04-07 MED ORDER — LACTATED RINGERS IV BOLUS: 1000.0000 mL | Freq: Once | INTRAVENOUS | Status: DC

## 2019-04-07 MED ORDER — DEXAMETHASONE SODIUM PHOSPHATE 10 MG/ML IJ SOLN
10.0000 mg | Freq: Once | INTRAMUSCULAR | Status: AC
  Administered 2019-04-07: 10 mg via INTRAVENOUS
  Filled 2019-04-07: qty 1

## 2019-04-07 MED ORDER — LABETALOL HCL 5 MG/ML IV SOLN
10.0000 mg | Freq: Once | INTRAVENOUS | Status: AC
  Administered 2019-04-07: 10 mg via INTRAVENOUS
  Filled 2019-04-07: qty 4

## 2019-04-07 MED ORDER — PROPOFOL 1000 MG/100ML IV EMUL
INTRAVENOUS | Status: DC
  Filled 2019-04-07: qty 100

## 2019-04-07 MED ORDER — NICARDIPINE HCL IN NACL 20-0.86 MG/200ML-% IV SOLN: 3.0000 mg/h | INTRAVENOUS | Status: DC

## 2019-04-07 MED ORDER — LEVETIRACETAM IN NACL 1500 MG/100ML IV SOLN
3000.0000 mg | Freq: Once | INTRAVENOUS | Status: AC
  Administered 2019-04-07: 04:00:00 3000 mg via INTRAVENOUS
  Filled 2019-04-07 (×2): qty 200

## 2019-04-07 MED ORDER — PROPOFOL 1000 MG/100ML IV EMUL
INTRAVENOUS | Status: AC | PRN
  Administered 2019-04-07: 10 ug/kg/min via INTRAVENOUS

## 2019-04-07 MED ORDER — SODIUM CHLORIDE 0.9 % IV BOLUS
1000.0000 mL | Freq: Once | INTRAVENOUS | Status: AC
  Administered 2019-04-07: 1000 mL via INTRAVENOUS

## 2019-04-07 MED ORDER — PROPOFOL 1000 MG/100ML IV EMUL
INTRAVENOUS | Status: AC
  Filled 2019-04-07: qty 100

## 2019-04-07 NOTE — ED Notes (Signed)
[facility] transport here to pick up patient

## 2019-04-07 NOTE — Progress Notes (Signed)
Pt transported to CT and returned to ED 3 on vent without incident. Pt remains on vent and is tol well at this time.

## 2019-04-07 NOTE — ED Notes (Signed)
Attempted to call report to [facility] without success

## 2019-04-07 NOTE — ED Provider Notes (Signed)
[facility] Emergency Department Provider Note  ____________________________________________   First MD Initiated Contact with Patient 04/07/19 0345     (approximate)  I have reviewed the triage vital signs and the nursing notes.   HISTORY  Chief Complaint Seizures and Respiratory Distress  Level 5 caveat:  history/ROS limited by acute/critical illness  HPI Shannon Petty is a 69 y.o. female with medical history most notable for glioblastoma status post craniectomy and who is currently undergoing chemotherapy at [facility] under the care of Dr. Hali Marry.  She presents tonight by EMS for evaluation of seizures.  Upon arrival to the ED the patient is having agonal respirations with leftward gaze deviation.  She is not protecting her airway and minimally responsive.  See hospital course for additional details.         Past Medical History:  Diagnosis Date  . Benign paroxysmal positional vertigo    unspecified laterality  . Controlled gout   . Depression   . Eczema   . Gallstones 01/11/2017  . Glioblastoma ([place])    treated at [facility], s/p craniectomy and being treated with chemotherapy, Dr. Hali Marry is her main provider  . History of laparoscopic adjustable gastric banding   . Hyperlipidemia   . Hypertension   . Obesity   . OSA (obstructive sleep apnea)   . Venous insufficiency of both lower extremities   . Vitamin D deficiency     Patient Active Problem List   Diagnosis Date Noted  . 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 Glioblastoma ([place]) 06/13/2018  . Malabsorption 09/25/2017  . Low magnesium level 09/25/2017  . Low serum vitamin A 09/25/2017  . Elevated parathyroid hormone 08/31/2017  . Barrett esophagus 10/18/2014  . Morbid obesity ([place]) 10/18/2014  . Chronic eczema 10/18/2014  . GERD (gastroesophageal reflux disease) 10/18/2014  . Hypertension, benign 10/18/2014  . Vitamin D deficiency 10/18/2014  . Controlled gout  10/18/2014  . Hyperlipidemia 10/18/2014  . Venous insufficiency 10/18/2014  . OSA on CPAP 10/18/2014  . 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 History of bariatric surgery 04/23/2011    Past Surgical History:  Procedure Laterality Date  . 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 BUNIONECTOMY  2003  . CHOLECYSTECTOMY, LAPAROSCOPIC  01/31/2017  . LAPAROSCOPIC GASTRIC BAND REMOVAL WITH LAPAROSCOPIC GASTRIC SLEEVE RESECTION  04/23/2011  . LAPAROSCOPIC GASTRIC BANDING  2008  . LAPAROSCOPIC GASTRIC RESTRICTIVE DUODENAL PROCEDURE (DUODENAL SWITCH)  01/31/2017    Prior to Admission medications   Medication Sig Start Date End Date Taking? Authorizing Provider  amLODipine (NORVASC) 5 MG tablet Take 1 tablet (5 mg total) by mouth daily. 03/27/18  Yes Sowles, Drue Stager, MD  atorvastatin (LIPITOR) 20 MG tablet TAKE 1 TABLET BY MOUTH EVERY DAY 03/25/19  Yes Sowles, Drue Stager, MD  Calcium Carb-Cholecalciferol (CALCIUM-VITAMIN D) 500-200 MG-UNIT tablet Take 1 tablet by mouth daily.    Yes [provider]  cholecalciferol (VITAMIN D) 1000 UNITS tablet Take 1 tablet (1,000 Units total) by mouth daily. 10/18/14  Yes Sowles, Drue Stager, MD  dexamethasone (DECADRON) 4 MG tablet Take 2-4 mg by mouth 2 (two) times daily. Take 4 mg by mouth in the morning and 2 mg by mouth in the evening for a total of 6 mg daily. 03/16/19  Yes [provider]  escitalopram (LEXAPRO) 10 MG tablet Take 1 tablet (10 mg total) by mouth daily. 10/10/18  Yes Sowles, Drue Stager, MD  febuxostat (ULORIC) 40 MG tablet TAKE 1 TABLET  BY MOUTH EVERY DAY 03/25/19  Yes Sowles, Drue Stager, MD  levETIRAcetam (KEPPRA) 500 MG tablet Take 500 mg by mouth every 12 (twelve) hours. 09/14/18  Yes [provider]  Multiple Vitamin (MULTI-VITAMINS) TABS Take 1 tablet by mouth daily.    Yes [provider]  NEXIUM 40 MG capsule TAKE 1 CAPSULE BY MOUTH TWO TIMES DAILY Patient taking differently: Take 40 mg  by mouth every evening.  01/03/18  Yes Lucilla Lame, MD  prochlorperazine (COMPAZINE) 10 MG tablet Take 10 mg by mouth as needed.  07/02/18  Yes [provider]  spironolactone (ALDACTONE) 50 MG tablet TAKE 1 TABLET BY MOUTH EVERY DAY 03/25/19  Yes Sowles, Drue Stager, MD  aspirin EC 81 MG tablet Take 81 mg by mouth daily.    [provider]    Allergies Omega 3  [fish oil], Pistachio nut extract skin test, and Allopurinol  Family History  Problem Relation Age of Onset  . Hypertension Mother   . Diabetes Mother   . Diabetes Brother   . Multiple sclerosis Brother     Social History Social History   Tobacco Use  . Smoking status: Former Smoker    Packs/day: 0.50    Years: 8.00    Pack years: 4.00    Types: Cigarettes    Start date: 01/23/1976    Quit date: 10/17/1984    Years since quitting: 34.4  . Smokeless tobacco: Never Used  . Tobacco comment: smoking cessation materials not required  Substance Use Topics  . Alcohol use: Not Currently    Alcohol/week: 0.0 standard drinks  . Drug use: No    Review of Systems Level 5 caveat:  history/ROS limited by acute/critical illness ____________________________________________   PHYSICAL EXAM:  VITAL SIGNS: ED Triage Vitals  Enc Vitals Group     BP 04/07/19 0345 (!) 156/111     Pulse Rate 04/07/19 0345 (!) 147     Resp 04/07/19 0345 (!) 22     Temp 04/07/19 0406 (!) 96.4 F (35.8 C)     Temp Source 04/07/19 0406 Rectal     SpO2 04/07/19 0345 96 %     Weight 04/07/19 0407 88.9 kg (195 lb 15.8 oz)     Height 04/07/19 0407 1.651 m (5' 5")     Head Circumference --      Peak Flow --      Pain Score --      Pain Loc --      Pain Edu? --      Excl. in [place]? --     Constitutional: Critically ill appearance.  Best GCS is 5. Eyes: Leftward gaze deviation, pupils are normal size but unresponsive to light. Head: No acute abnormalities noted status post known craniectomy. Nose: No  congestion/rhinnorhea. Mouth/Throat: Dry mucous membranes, no debris in airway. Neck: No stridor.  No meningeal signs.   Cardiovascular: Tachycardia, regular rhythm. Good peripheral circulation. Grossly normal heart sounds. Respiratory: Agonal respirations but with good air movement, no wheezing, rales, nor rhonchi. Gastrointestinal: Soft and nontender. No distention.  Musculoskeletal: No lower extremity tenderness nor edema. No gross deformities of extremities. Neurologic: GCS of 5 (Eye 1, Verbal 1, Motor 2-3 (initially she appeared to have decerebrate/extension posturing, but when PIV was started she flexed away from it)). Skin:  Skin is warm, dry and intact.  ____________________________________________   LABS (all labs ordered are listed, but only abnormal results are displayed)  Labs Reviewed  CBC WITH DIFFERENTIAL/PLATELET - Abnormal; Notable for the following components:  Result Value   WBC 20.7 (*)    Hemoglobin 15.9 (*)    HCT 46.3 (*)    Neutro Abs 17.4 (*)    Abs Immature Granulocytes 0.53 (*)    All other components within normal limits  LACTIC ACID, PLASMA - Abnormal; Notable for the following components:   Lactic Acid, Venous 7.0 (*)    All other components within normal limits  LACTIC ACID, PLASMA - Abnormal; Notable for the following components:   Lactic Acid, Venous 2.0 (*)    All other components within normal limits  COMPREHENSIVE METABOLIC PANEL - Abnormal; Notable for the following components:   Potassium 5.6 (*)    CO2 20 (*)    Glucose, Bld 200 (*)    BUN 37 (*)    AST 58 (*)    ALT 63 (*)    Alkaline Phosphatase 157 (*)    All other components within normal limits  URINALYSIS, COMPLETE (UACMP) WITH MICROSCOPIC - Abnormal; Notable for the following components:   Color, Urine YELLOW (*)    APPearance HAZY (*)    Leukocytes,Ua TRACE (*)    Bacteria, UA MANY (*)    All other components within normal limits  BLOOD GAS, ARTERIAL - Abnormal; Notable  for the following components:   pH, Arterial 7.33 (*)    pO2, Arterial 516 (*)    Acid-base deficit 4.5 (*)    All other components within normal limits  TROPONIN I (HIGH SENSITIVITY) - Abnormal; Notable for the following components:   Troponin I (High Sensitivity) 21 (*)    All other components within normal limits  RESPIRATORY PANEL BY RT PCR (FLU A&B, COVID)  URINE CULTURE  MAGNESIUM  URINE DRUG SCREEN, QUALITATIVE ([facility] ONLY)  ETHANOL  PROTIME-INR  APTT  POC SARS CORONAVIRUS 2 AG -  ED   ____________________________________________  EKG  ED ECG REPORT #1 I, Hinda Kehr, the attending physician, personally viewed and interpreted this ECG.  Date: 04/07/2019 EKG Time: 3:41 AM Rate: 138 Rhythm: Sinus tachycardia QRS Axis: normal Intervals: No gross interval abnormalities. ST/T Wave abnormalities: Non-specific ST segment / T-wave changes, but no clear evidence of acute ischemia. Narrative Interpretation: no definitive evidence of acute ischemia; does not meet STEMI criteria.    ED ECG REPORT #2 I, Hinda Kehr, the attending physician, personally viewed and interpreted this ECG.  Date: 04/07/2019 EKG Time: 3:59 AM Rate: 146 Rhythm: Sinus tachycardia QRS Axis: normal Intervals: normal ST/T Wave abnormalities: Non-specific ST segment / T-wave changes, but no clear evidence of acute ischemia.  Likely rate related changes. Narrative Interpretation: no definitive evidence of acute ischemia; does not meet STEMI criteria.   ED ECG REPORT #3 I, Hinda Kehr, the attending physician, personally viewed and interpreted this ECG.  Date: 04/07/2019 EKG Time: 5:45 AM Rate: 90 Rhythm: normal sinus rhythm QRS Axis: normal Intervals: normal ST/T Wave abnormalities: normal Narrative Interpretation: no evidence of acute ischemia    ____________________________________________  RADIOLOGY I, Hinda Kehr, personally viewed and evaluated these images (plain radiographs) as  part of my medical decision making, as well as reviewing the written report by the radiologist.  I also discussed the CT head with the radiologist.  ED MD interpretation: Appropriate placement of endotracheal and NG tubes.  The noncontrast head CT demonstrates no evidence of acute hemorrhage.  The patient has what the radiologist was concerned is localized recurrence of the glioblastoma with some vasogenic edema but no midline shift.  Official radiology report(s): DG Abdomen 1 View  Result Date: 04/07/2019 CLINICAL DATA:  OG tube placement. EXAM: ABDOMEN - 1 VIEW COMPARISON:  One-view chest x-ray 04/07/2018 FINDINGS: Side port of the NG tube is in the stomach. Colon is mildly dilated. Lung bases are clear. IMPRESSION: Side port of the NG tube is in the stomach. Electronically Signed   By: San Morelle M.D.   On: 04/07/2019 04:19   CT Head Wo Contrast  Result Date: 04/07/2019 CLINICAL DATA:  Seizure, history of glioblastoma post partial resection and craniectomy EXAM: CT HEAD WITHOUT CONTRAST TECHNIQUE: Contiguous axial images were obtained from the base of the skull through the vertex without intravenous contrast. COMPARISON:  CT 05/26/2018, MRI 08/27/2018 FINDINGS: Brain: There are postsurgical changes from left frontal craniectomy and frontal lobe mass resection of the known glioblastoma seen on comparison MR and CT images. There is associated encephalomalacia of the left frontal lobe. Mixed attenuation seen along the inferior extent of the resection cavity has an almost masslike appearance with surrounding hypoattenuation concerning for vasogenic edema and potential local recurrence (2/14, 2/13) though a noncontrast CT is inadequate for this assessment. Additional hypoattenuation is seen in the subcortical and periventricular white matter of the anterior right frontal lobe as well, nonspecific. No CT evidence of acute large territory infarct. No acute intracranial hemorrhage or extra-axial  collection is seen. Mild leftward mediastinal shift towards the resection site is likely related to volume loss in the left frontal lobe. No other sites of shift or evidence of herniation. Basal cisterns are patent. Vascular: Atherosclerotic calcification of the carotid siphons. No hyperdense vessel. Skull: Postsurgical changes from left frontal craniectomy. Expected appearance of the overlying skin at the cranial defect. No acute or suspicious osseous or soft tissue abnormality seen elsewhere within the cranium. Sinuses/Orbits: Paranasal sinuses and mastoid air cells are predominantly clear. Included orbital structures are unremarkable. Other: None IMPRESSION: Postsurgical changes from left frontal craniectomy and frontal lobe and prior mass resection of the known glioblastoma seen on comparison MR and CT images. Mixed attenuation seen along the inferior extent of the resection cavity has an almost masslike appearance with surrounding white matter hypoattenuation concerning for vasogenic edema and potential local recurrence. Additional hypoattenuation is seen in the subcortical and periventricular white matter of the anterior right frontal lobe as well, nonspecific. These results were called by telephone at the time of interpretation on 04/07/2019 at 5:01 am to provider [facility] , who verbally acknowledged these results. Electronically Signed   By: Lovena Le M.D.   On: 04/07/2019 05:00   DG Chest Portable 1 View  Result Date: 04/07/2019 CLINICAL DATA:  Endotracheal tube placement. EXAM: PORTABLE CHEST 1 VIEW COMPARISON:  Two-view chest x-ray 03/10/2012 FINDINGS: Heart size is normal. The patient is intubated. Endotracheal tube terminates 4.1 cm above the carina, in satisfactory position. Side port of the NG tube is in the proximal stomach. Atherosclerotic calcifications are present at the gallbladder fossa. IMPRESSION: 1. Satisfactory positioning of endotracheal tube. 2. The side port of the NG tube is in  the proximal stomach. Electronically Signed   By: San Morelle M.D.   On: 04/07/2019 04:18    ____________________________________________   PROCEDURES   Procedure(s) performed (including Critical Care):  .Critical Care Performed by: Hinda Kehr, MD Authorized by: Hinda Kehr, MD   Critical care provider statement:    Critical care time (minutes):  75   Critical care time was exclusive of:  Separately billable procedures and treating other patients   Critical care was necessary to treat or prevent  imminent or life-threatening deterioration of the following conditions:  CNS failure or compromise   Critical care was time spent personally by me on the following activities:  Development of treatment plan with patient or surrogate, discussions with consultants, evaluation of patient's response to treatment, examination of patient, obtaining history from patient or surrogate, ordering and performing treatments and interventions, ordering and review of laboratory studies, ordering and review of radiographic studies, pulse oximetry, re-evaluation of patient's condition and review of old charts Procedure Name: Intubation Date/Time: 04/07/2019 7:20 AM Performed by: Hinda Kehr, MD Pre-anesthesia Checklist: Patient identified, Emergency Drugs available, Suction available and Patient being monitored Preoxygenation: Pre-oxygenation with 100% oxygen Induction Type: IV induction and Rapid sequence Laryngoscope Size: Glidescope and 3 Tube size: 7.5 mm Number of attempts: 1 Placement Confirmation: ETT inserted through vocal cords under direct vision,  CO2 detector and Breath sounds checked- equal and bilateral Secured at: 23 cm Tube secured with: ETT holder Dental Injury: Teeth and Oropharynx as per pre-operative assessment     .1-3 Lead EKG Interpretation Performed by: Hinda Kehr, MD Authorized by: Hinda Kehr, MD     Interpretation: abnormal     ECG rate:  140   ECG rate  assessment: tachycardic     Rhythm: sinus rhythm     Ectopy: none       ____________________________________________   INITIAL IMPRESSION / MDM / ASSESSMENT AND PLAN / ED COURSE  As part of my medical decision making, I reviewed the following data within the [place] History obtained from family, Nursing notes reviewed and incorporated, Labs reviewed , EKG interpreted , Old chart reviewed, Radiograph reviewed , Discussed with radiologist, discussed with [facility] neurosurgeon, Discussed with admitting physician, and reviewed Notes from prior ED visits   Differential diagnosis includes, but is not limited to, vasogenic edema likely due to recurrent brain tumor, intracranial hemorrhage, acute infection including meningitis or encephalitis, metabolic or electrolyte abnormality, medication or drug side effect.  The patient is presenting in extremis and status epilepticus without response to the Versed and still seizing upon arrival.  She is not protecting her airway and I intubated her once IV access has been established.  She was given etomidate 10 mg IV and rocuronium 100 mg IV for the intubation.  After her airway was established I ordered Keppra 3 g IV since she takes Keppra as an outpatient.  I ordered 1 L normal saline IV bolus.  The patient is hypertensive and tachycardic she believes is secondary to her ongoing seizure.  She gets all of her care at [facility] and she will need transfer back to [facility] regardless of the exact etiology of her seizure disorder and I will begin the transfer process.  I spoke with her husband and he confirmed that she is full code.  I updated him about her status and he agrees with the plan for transfer to [facility].  He reports that her behavior was off tonight and that she was having some twitching behaviors and then after they went to bed she developed generalized tonic-clonic seizures with leftward gaze deviation.  He said she has been compliant with her  medication and she has not been sick recently with any fever or other new or different symptoms.      Clinical Course as of Apr 07 726  Tue Apr 07, 2019  0404 WBC(!): 20.7 [CF]  0409 Appropriate tube placement on plain films   [CF]  0409 I spoke with the [facility] transfer center and explained  the situation.  They are going to find the appropriate provider and call me back.   [CF]  0409 Negative POC COVID-19 antigen test  POC SARS Coronavirus 2 Ag-ED - Nasal Swab (BD Veritor Kit) [CF]  0410 Mild LFT elevations and potassium 5.6, otherwise unremarkable.  EKG generally reassuring, no acute changes other than tachycardia.  Comprehensive metabolic panel(!) [CF]  6294 I spoke by phone with Dr. Devona Konig at [facility].  He accepted the patient for transfer and asked that I call him back if there is anything grossly abnormal on the head CT such as an acute intracranial bleed.  I let him know about the therapies provided thus far and he had no further recommendations at this time, acknowledging that the head CT may provide additional details or recommendations.  They are going to work on making an ICU bed for her and will call us back with a bed assignment.   [CF]  [id number] I updated Dr. Jeneen Rinks at [facility] by phone.  He said that the hospital is on diversion but he will speak with the [facility] about getting her a bed.  I discussed her CT results and he agrees with the management thus far.  I asked for guidance regarding her hypertension and he recommended treating it with a target systolic blood pressure of about 140.  He agreed with the recommendation for nicardipine and I have ordered nicardipine for blood pressure control.   [CF]  0437 SARS Coronavirus 2 by RT PCR: NEGATIVE [CF]  0504 I discussed the case by phone with the neuroradiologist.  He said that there is no acute bleeding and no midline shift, but there are some changes around the site of the prior resection and making concern for recurrence of the tumor,  including some localized vasogenic edema.  I have placed a call to the [facility] transfer center to provide an update for them and for Dr. Jeneen Rinks.     [CF]  0505 I reassessed the patient.  She remains tachycardic but the tachycardia has improved.  She remains labile with her blood pressure, initially it was moderate but now her pressure is 174/109.  I am continuing normal saline IV bolus given the lactic acidosis likely secondary to her seizure activity.  She has already received the Keppra 3 g IV and the Decadron 10 mg IV.   [CF]  0519 Blood pressure has now come down to 154/94.  Nicardipine may overshoot the systolic blood pressure goal.  Because she is so labile with a blood pressure I will instead give her a dose of labetalol 10 mg IV and see how she responds.   [CF]  0524 SARS Coronavirus 2 by RT PCR: NEGATIVE [CF]  0611 The patient is noted to have a lactate>4. With the current information available to me, I don't think the patient is in septic shock. The lactate>4, is related to seizure activity    [CF]  [phone] Huge improvement of lactic acid from 7 to 2, again strongly suggestive of seizure activity and not sepsis.  Lactic Acid, Venous(!!): 2.0 [CF]    Clinical Course User Index [CF] Hinda Kehr, MD     ____________________________________________  FINAL CLINICAL IMPRESSION(S) / ED DIAGNOSES  Final diagnoses:  Status epilepticus ([place])  Glioblastoma ([place])  Hyperkalemia  Elevated LFTs  Hypothermia, initial encounter  Hypertension, unspecified type  Vasogenic brain edema ([facility])  Elevated troponin level     MEDICATIONS GIVEN DURING THIS VISIT:  Medications  propofol (DIPRIVAN) 1000 MG/100ML infusion ( Intravenous  Transfusing/Transfer 04/07/19 0650)  levETIRAcetam (KEPPRA) IVPB 1500 mg/ 100 mL premix (0 mg Intravenous Stopped 04/07/19 0448)  propofol (DIPRIVAN) 1000 MG/100ML infusion ( Intravenous Stopped 04/07/19 0410)  dexamethasone (DECADRON) injection 10 mg (10 mg Intravenous  Given 04/07/19 0411)  sodium chloride 0.9 % bolus 1,000 mL (0 mLs Intravenous Stopped 04/07/19 0649)  labetalol (NORMODYNE) injection 10 mg (10 mg Intravenous Given 04/07/19 0542)     ED Discharge Orders    None      *Please note:  Neema Barreira was evaluated in Emergency Department on 04/07/2019 for the symptoms described in the history of present illness. She was evaluated in the context of the global COVID-19 pandemic, which necessitated consideration that the patient might be at risk for infection with the SARS-CoV-2 virus that causes COVID-19. Institutional protocols and algorithms that pertain to the evaluation of patients at risk for COVID-19 are in a state of rapid change based on information released by regulatory bodies including the CDC and federal and state organizations. These policies and algorithms were followed during the patient's care in the ED.  Some ED evaluations and interventions may be delayed as a result of limited staffing during the pandemic.*  Note:  This document was prepared using Dragon voice recognition software and may include unintentional dictation errors.   Hinda Kehr, MD 04/07/19 [phone]

## 2019-04-07 NOTE — ED Notes (Signed)
Husband signed transfer consent

## 2019-04-07 NOTE — ED Notes (Signed)
HOB elevated, husband is at bedside

## 2019-04-07 NOTE — ED Notes (Addendum)
Patient has some thin clear oral secretions, suctioned patient's mouth. Appears comfortable, received bolus of propofol for biting tube and agitation

## 2019-04-07 NOTE — ED Notes (Signed)
Patient transported to [facility]. Husband given room number and visiting hour information

## 2019-04-07 NOTE — ED Notes (Signed)
Notified pharmacy to send Keppra dose

## 2019-04-07 NOTE — ED Triage Notes (Signed)
Patient arrives from home via EMS with continuous seizures, patient has brain cancer. EMS witnessed at least 2 separate seizures occur, IM versed ineffective. Pt has L fixed gaze, arrives to ED unresponsive and agonal/snoring respirations. EMS bagging patient. Patient appears to be posturing on L side

## 2019-04-07 NOTE — ED Notes (Signed)
Awaiting call back from RN at [facility] to give report

## 2019-04-07 NOTE — ED Notes (Signed)
[facility]AL[facility] and Medical Necessity documentation reviewed at this time and found to be complete per policy. 

## 2019-04-07 NOTE — ED Notes (Signed)
Transported to CT 

## 2019-04-08 MED ORDER — LABETALOL HCL 5 MG/ML IV SOLN
10.00 | INTRAVENOUS | Status: DC
Start: ? — End: 2019-04-08

## 2019-04-08 MED ORDER — PROPOFOL 100 MG/10ML IV EMUL
5.00 | INTRAVENOUS | Status: DC
Start: ? — End: 2019-04-08

## 2019-04-08 MED ORDER — CHLORHEXIDINE GLUCONATE 0.12 % MT SOLN
5.00 | OROMUCOSAL | Status: DC
Start: 2019-04-10 — End: 2019-04-08

## 2019-04-08 MED ORDER — LIDOCAINE HCL 1 % IJ SOLN
0.50 | INTRAMUSCULAR | Status: DC
Start: ? — End: 2019-04-08

## 2019-04-08 MED ORDER — GLUCAGON (RDNA) 1 MG IJ KIT
1.00 | PACK | INTRAMUSCULAR | Status: DC
Start: ? — End: 2019-04-08

## 2019-04-08 MED ORDER — GENERIC EXTERNAL MEDICATION
Status: DC
Start: ? — End: 2019-04-08

## 2019-04-08 MED ORDER — MIDAZOLAM HCL 5 MG/ML IJ SOLN
0.10 | INTRAMUSCULAR | Status: DC
Start: ? — End: 2019-04-08

## 2019-04-08 MED ORDER — DEXTROSE 50 % IV SOLN
12.50 | INTRAVENOUS | Status: DC
Start: ? — End: 2019-04-08

## 2019-04-08 MED ORDER — ATORVASTATIN CALCIUM 10 MG PO TABS
20.00 | ORAL_TABLET | ORAL | Status: DC
Start: 2019-04-11 — End: 2019-04-08

## 2019-04-08 MED ORDER — SODIUM CHLORIDE 0.9 % IV SOLN
INTRAVENOUS | Status: DC
Start: ? — End: 2019-04-08

## 2019-04-08 MED ORDER — HEPARIN SODIUM (PORCINE) 5000 UNIT/ML IJ SOLN
5000.00 | INTRAMUSCULAR | Status: DC
Start: 2019-04-10 — End: 2019-04-08

## 2019-04-08 MED ORDER — GENERIC EXTERNAL MEDICATION
40.00 | Status: DC
Start: 2019-04-11 — End: 2019-04-08

## 2019-04-08 MED ORDER — ACETAMINOPHEN 325 MG PO TABS
650.00 | ORAL_TABLET | ORAL | Status: DC
Start: ? — End: 2019-04-08

## 2019-04-08 MED ORDER — INSULIN REGULAR HUMAN 100 UNIT/ML IJ SOLN
0.00 | INTRAMUSCULAR | Status: DC
Start: 2019-04-10 — End: 2019-04-08

## 2019-04-08 MED ORDER — GENERIC EXTERNAL MEDICATION
1000.00 | Status: DC
Start: 2019-04-08 — End: 2019-04-08

## 2019-04-08 MED ORDER — DEXAMETHASONE 4 MG PO TABS
4.00 | ORAL_TABLET | ORAL | Status: DC
Start: 2019-04-10 — End: 2019-04-08

## 2019-04-08 MED ORDER — ESCITALOPRAM OXALATE 10 MG PO TABS
10.00 | ORAL_TABLET | ORAL | Status: DC
Start: 2019-04-11 — End: 2019-04-08

## 2019-04-08 MED ORDER — POLYETHYLENE GLYCOL 3350 17 GM/SCOOP PO POWD
17.00 | ORAL | Status: DC
Start: 2019-04-11 — End: 2019-04-08

## 2019-04-08 MED ORDER — SENNOSIDES-DOCUSATE SODIUM 8.6-50 MG PO TABS
2.00 | ORAL_TABLET | ORAL | Status: DC
Start: 2019-04-10 — End: 2019-04-08

## 2019-04-08 MED ORDER — HYDRALAZINE HCL 20 MG/ML IJ SOLN
10.00 | INTRAMUSCULAR | Status: DC
Start: ? — End: 2019-04-08

## 2019-04-09 LAB — URINE CULTURE
Culture: 50000 — AB
Special Requests: NORMAL

## 2019-04-10 MED ORDER — LEVETIRACETAM 500 MG PO TABS
1000.00 | ORAL_TABLET | ORAL | Status: DC
Start: 2019-04-10 — End: 2019-04-10

## 2019-04-10 MED ORDER — GENERIC EXTERNAL MEDICATION
Status: DC
Start: ? — End: 2019-04-10

## 2019-04-10 MED ORDER — MAGNESIUM SULFATE 2 GM/50ML IV SOLN
2.00 | INTRAVENOUS | Status: DC
Start: ? — End: 2019-04-10

## 2019-04-10 MED ORDER — VALPROIC ACID 250 MG/5ML PO SOLN
500.00 | ORAL | Status: DC
Start: 2019-04-10 — End: 2019-04-10

## 2019-05-06 ENCOUNTER — Other Ambulatory Visit: Payer: Self-pay | Admitting: Family Medicine

## 2019-05-06 DIAGNOSIS — I10 Essential (primary) hypertension: Secondary | ICD-10-CM

## 2019-05-23 DEATH — deceased

## 2019-06-18 ENCOUNTER — Other Ambulatory Visit: Payer: Self-pay | Admitting: Family Medicine

## 2019-06-18 DIAGNOSIS — F4321 Adjustment disorder with depressed mood: Secondary | ICD-10-CM

## 2020-06-19 IMAGING — CT CT HEAD WITHOUT CONTRAST
3 of 4 series · 14 of 47 positions shown, 16 images · non-contrast
Comparison: None.

CLINICAL DATA: Chronic headache in dizziness over the last 3 weeks.
No history of trauma. Altered mental status. Memory disturbance.

EXAM:
CT HEAD WITHOUT CONTRAST
TECHNIQUE: Contiguous axial images were obtained from the base of the skull
through the vertex without intravenous contrast.

[Series 2: head · axial · 0.39mm/px · z∈[-585,-460]mm · 8 of 31 slices shown, 10 images (1 of 3)]
[im 3/31  brain]
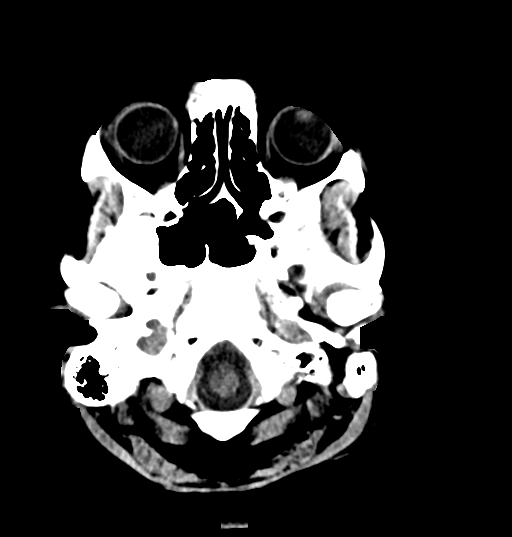
[im 3/31  bone]
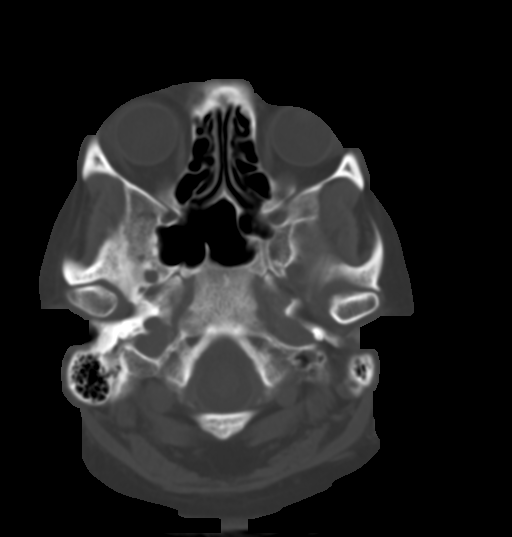
[im 7/31  brain]
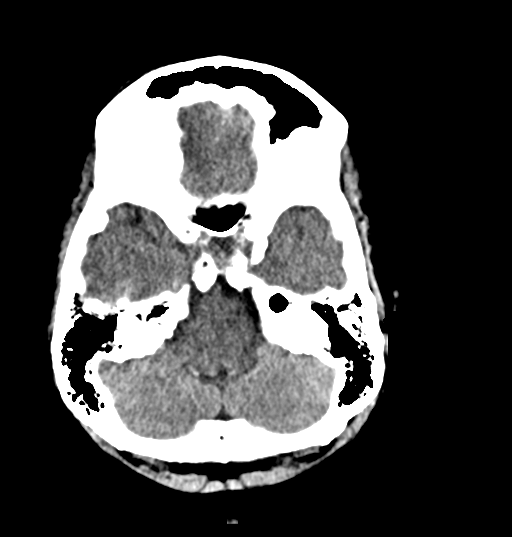
[im 11/31  brain]
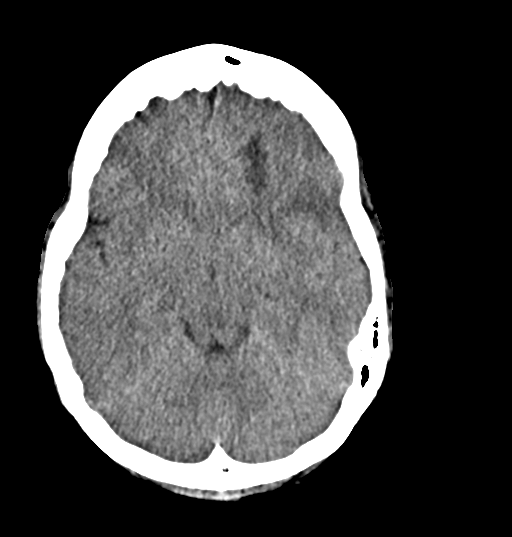
[im 13/31  brain]
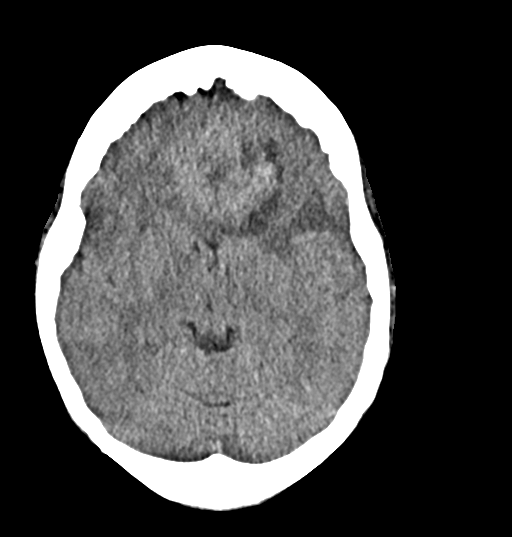
[im 18/31  brain]
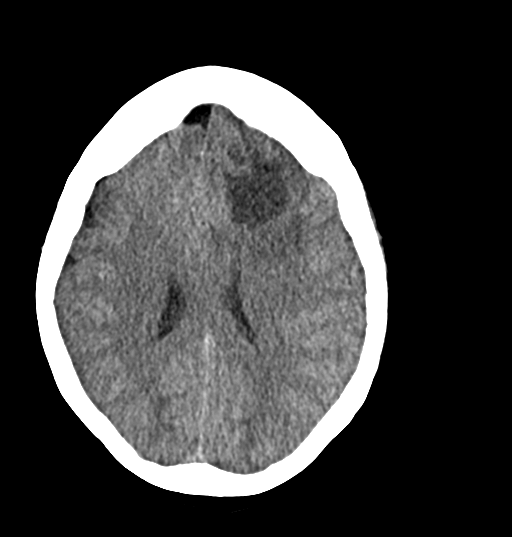
[im 18/31  bone]
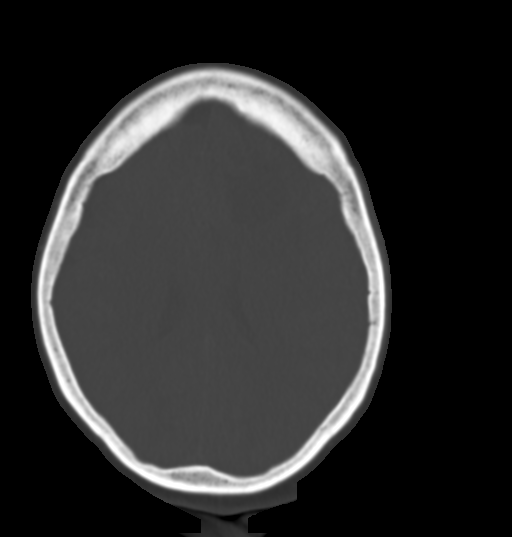
[im 20/31  brain]
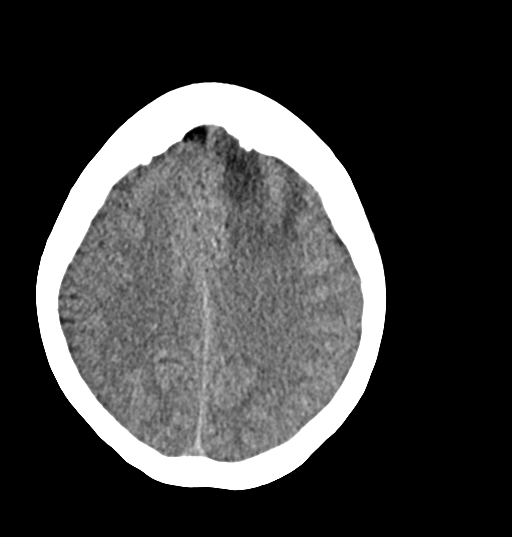
[im 24/31  brain]
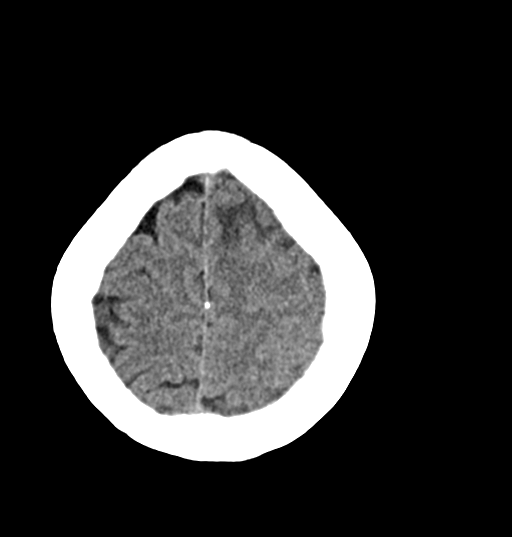
[im 28/31  brain]
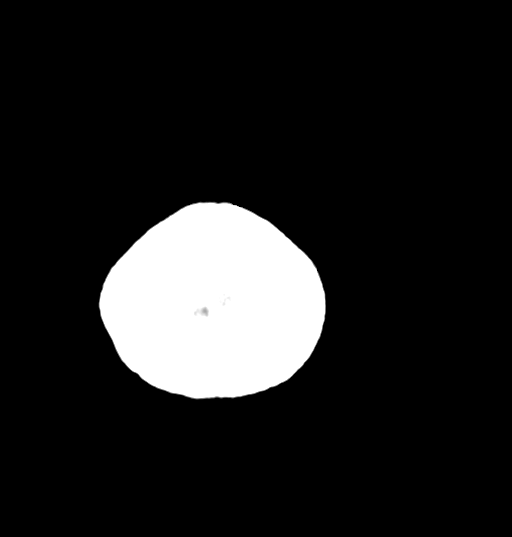

[Series 6: head · coronal · 0.31mm/px · 3 of 68 slices shown (2 of 3)]
[im 23/68  brain]
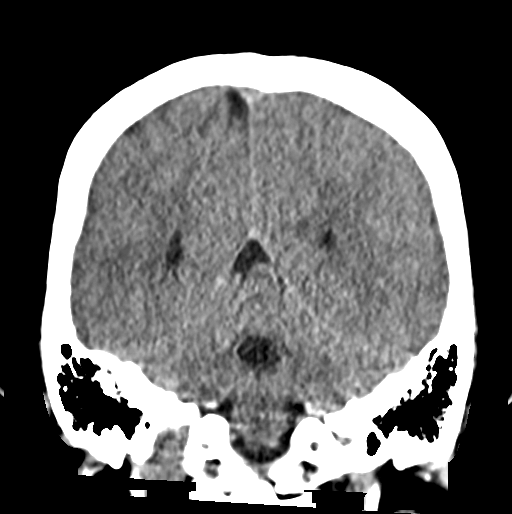
[im 30/68  brain]
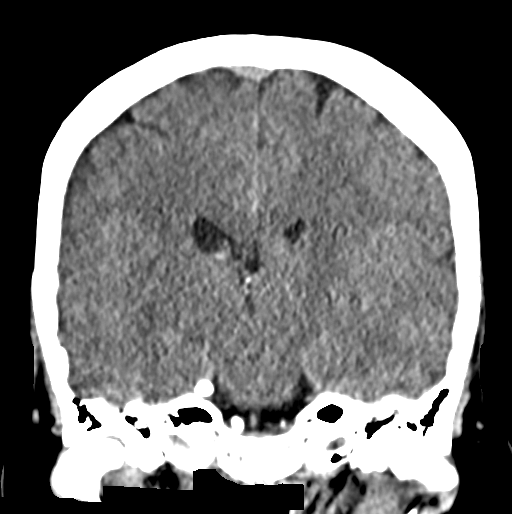
[im 38/68  brain]
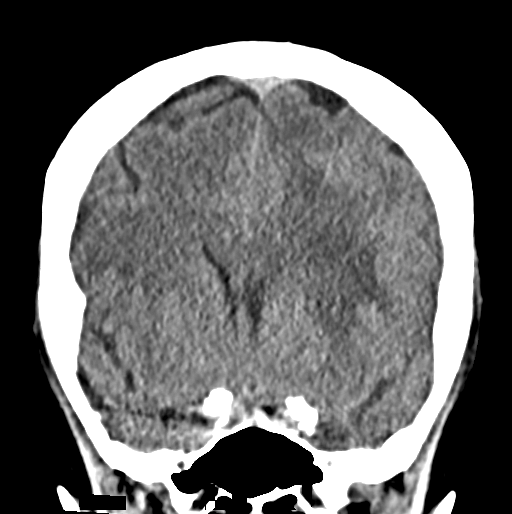

[Series 8: head · sagittal · 0.31mm/px · 3 of 53 slices shown (3 of 3)]
[im 18/53  brain]
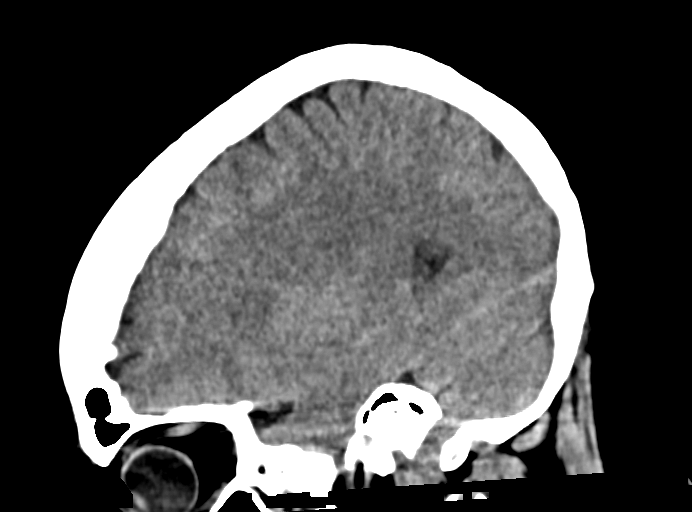
[im 27/53  brain]
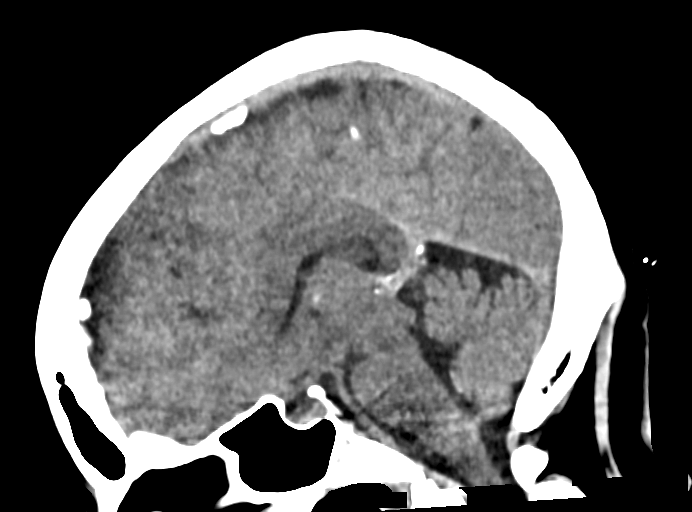
[im 35/53  brain]
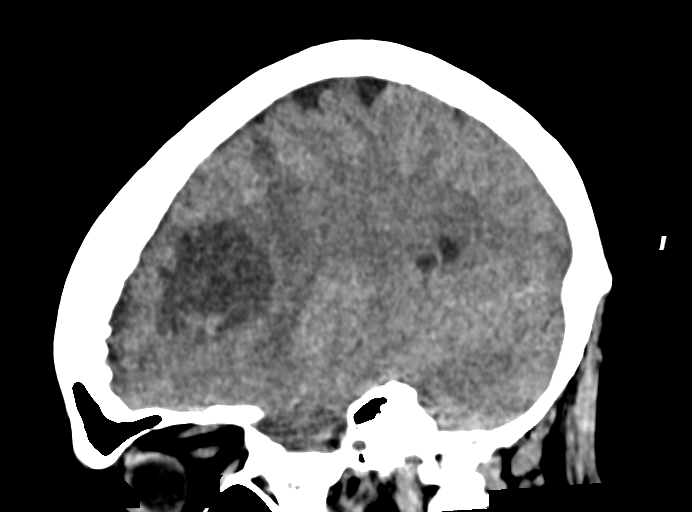

[14 of 47 positions shown; findings below may reference images not displayed]

FINDINGS: Brain: There is a 3.5-4.5 cm region of low-density in the left
frontal lobe. Slightly hyperdense material is present medial to
that. There is regional mass effect with left-to-right shift of 6
mm. No evidence of hemorrhage. No hydrocephalus or extra-axial
collection. The primary differential diagnostic consideration in
this case is that of left frontal glioblastoma versus is left para
falcine meningioma with adjacent cystic change. Glioblastoma is
favored. MRI with without contrast is suggested.

Vascular: Negative

Skull: Normal

Sinuses/Orbits: Clear/normal

Other: None
IMPRESSION: Approximately 4 cm complex mass in the left frontal region, with
mass effect causing left-to-right shift of 6 mm. This probably
represents an aggressive intra-axial mass in the left frontal region
worrisome for glioblastoma. Differential diagnosis does include left
para falcine meningioma with adjacent cystic change. Brain abscess
is possible but not likely. MRI with and without contrast
recommended.

Call report in progress.
